# Patient Record
Sex: Male | Born: 1985 | Race: Black or African American | Hispanic: No | Marital: Single | Smoking: Never smoker
Health system: Southern US, Community
[De-identification: ages and names within clinical notes are randomized; demographics above are authoritative.]

## PROBLEM LIST (undated history)

## (undated) DIAGNOSIS — K859 Acute pancreatitis without necrosis or infection, unspecified: Secondary | ICD-10-CM

## (undated) DIAGNOSIS — L409 Psoriasis, unspecified: Secondary | ICD-10-CM

## (undated) DIAGNOSIS — T7840XA Allergy, unspecified, initial encounter: Secondary | ICD-10-CM

## (undated) DIAGNOSIS — K219 Gastro-esophageal reflux disease without esophagitis: Secondary | ICD-10-CM

## (undated) DIAGNOSIS — G43909 Migraine, unspecified, not intractable, without status migrainosus: Secondary | ICD-10-CM

## (undated) HISTORY — DX: Allergy, unspecified, initial encounter: T78.40XA

## (undated) HISTORY — DX: Migraine, unspecified, not intractable, without status migrainosus: G43.909

## (undated) HISTORY — DX: Psoriasis, unspecified: L40.9

## (undated) HISTORY — PX: LYMPH NODE DISSECTION: SHX5087

## (undated) HISTORY — DX: Gastro-esophageal reflux disease without esophagitis: K21.9

## (undated) HISTORY — PX: WISDOM TOOTH EXTRACTION: SHX21

---

## 2011-03-06 ENCOUNTER — Emergency Department: Payer: Self-pay | Admitting: Emergency Medicine

## 2011-05-07 ENCOUNTER — Emergency Department: Payer: Self-pay | Admitting: Emergency Medicine

## 2011-05-27 ENCOUNTER — Emergency Department (HOSPITAL_COMMUNITY)
Admission: EM | Admit: 2011-05-27 | Discharge: 2011-05-27 | Disposition: A | Discharge status: Home / Self Care | Payer: No Typology Code available for payment source | Attending: Emergency Medicine | Admitting: Emergency Medicine

## 2011-05-27 DIAGNOSIS — M79609 Pain in unspecified limb: Secondary | ICD-10-CM | POA: Insufficient documentation

## 2011-05-27 DIAGNOSIS — IMO0002 Reserved for concepts with insufficient information to code with codable children: Secondary | ICD-10-CM | POA: Insufficient documentation

## 2011-05-27 DIAGNOSIS — T148XXA Other injury of unspecified body region, initial encounter: Secondary | ICD-10-CM | POA: Insufficient documentation

## 2011-08-30 DIAGNOSIS — K859 Acute pancreatitis without necrosis or infection, unspecified: Secondary | ICD-10-CM

## 2011-08-30 HISTORY — DX: Acute pancreatitis without necrosis or infection, unspecified: K85.90

## 2011-10-09 ENCOUNTER — Emergency Department: Payer: Self-pay | Admitting: Internal Medicine

## 2011-10-09 LAB — URINALYSIS, COMPLETE
Bacteria: NONE SEEN
Bilirubin,UR: NEGATIVE
Glucose,UR: NEGATIVE mg/dL (ref 0–75)
Leukocyte Esterase: NEGATIVE
Nitrite: NEGATIVE
RBC,UR: 4 /HPF (ref 0–5)
Squamous Epithelial: NONE SEEN
WBC UR: <1 /HPF (ref 0–5)

## 2011-10-09 LAB — CBC
HCT: 44.9 % (ref 40.0–52.0)
HGB: 15.1 g/dL (ref 13.0–18.0)
RBC: 5.16 10*6/uL (ref 4.40–5.90)
RDW: 13.3 % (ref 11.5–14.5)
WBC: 14.2 10*3/uL — ABNORMAL HIGH (ref 3.8–10.6)

## 2011-10-09 LAB — COMPREHENSIVE METABOLIC PANEL
Alkaline Phosphatase: 81 U/L (ref 50–136)
Bilirubin,Total: 0.4 mg/dL (ref 0.2–1.0)
Calcium, Total: 8.9 mg/dL (ref 8.5–10.1)
Chloride: 101 mmol/L (ref 98–107)
Co2: 30 mmol/L (ref 21–32)
Creatinine: 0.88 mg/dL (ref 0.60–1.30)
EGFR (Non-African Amer.): >60
Osmolality: 276 (ref 275–301)
SGPT (ALT): 20 U/L

## 2011-10-22 ENCOUNTER — Emergency Department: Payer: Self-pay | Admitting: Emergency Medicine

## 2011-10-22 LAB — COMPREHENSIVE METABOLIC PANEL
BUN: 10 mg/dL (ref 7–18)
Bilirubin,Total: 1.2 mg/dL — ABNORMAL HIGH (ref 0.2–1.0)
Chloride: 102 mmol/L (ref 98–107)
Creatinine: 0.94 mg/dL (ref 0.60–1.30)
EGFR (African American): >60
Osmolality: 280 (ref 275–301)
SGPT (ALT): 19 U/L
Sodium: 141 mmol/L (ref 136–145)
Total Protein: 8.3 g/dL — ABNORMAL HIGH (ref 6.4–8.2)

## 2011-10-22 LAB — CBC
HGB: 16.4 g/dL (ref 13.0–18.0)
RBC: 5.61 10*6/uL (ref 4.40–5.90)
WBC: 15.7 10*3/uL — ABNORMAL HIGH (ref 3.8–10.6)

## 2011-10-22 LAB — LIPASE, BLOOD: Lipase: 339 U/L (ref 73–393)

## 2012-01-18 ENCOUNTER — Emergency Department: Payer: Self-pay | Admitting: Internal Medicine

## 2012-04-28 ENCOUNTER — Emergency Department: Payer: Self-pay | Admitting: Emergency Medicine

## 2012-04-28 LAB — COMPREHENSIVE METABOLIC PANEL
Alkaline Phosphatase: 99 U/L (ref 50–136)
BUN: 9 mg/dL (ref 7–18)
Chloride: 104 mmol/L (ref 98–107)
Creatinine: 0.99 mg/dL (ref 0.60–1.30)
EGFR (African American): >60
Glucose: 142 mg/dL — ABNORMAL HIGH (ref 65–99)
Potassium: 3.5 mmol/L (ref 3.5–5.1)
SGOT(AST): 23 U/L (ref 15–37)
SGPT (ALT): 13 U/L (ref 12–78)
Total Protein: 7.4 g/dL (ref 6.4–8.2)

## 2012-04-28 LAB — LIPASE, BLOOD: Lipase: 292 U/L (ref 73–393)

## 2012-04-28 LAB — CBC
HCT: 42.9 % (ref 40.0–52.0)
HGB: 14.5 g/dL (ref 13.0–18.0)
WBC: 9.4 10*3/uL (ref 3.8–10.6)

## 2013-02-08 ENCOUNTER — Emergency Department (HOSPITAL_COMMUNITY)
Admission: EM | Admit: 2013-02-08 | Discharge: 2013-02-09 | Disposition: A | Discharge status: Home / Self Care | Payer: Self-pay | Attending: Emergency Medicine | Admitting: Emergency Medicine

## 2013-02-08 ENCOUNTER — Emergency Department (HOSPITAL_COMMUNITY): Payer: Self-pay

## 2013-02-08 ENCOUNTER — Encounter (HOSPITAL_COMMUNITY): Payer: Self-pay

## 2013-02-08 DIAGNOSIS — R748 Abnormal levels of other serum enzymes: Secondary | ICD-10-CM

## 2013-02-08 DIAGNOSIS — R079 Chest pain, unspecified: Secondary | ICD-10-CM

## 2013-02-08 DIAGNOSIS — R109 Unspecified abdominal pain: Secondary | ICD-10-CM

## 2013-02-08 DIAGNOSIS — Z7982 Long term (current) use of aspirin: Secondary | ICD-10-CM | POA: Insufficient documentation

## 2013-02-08 DIAGNOSIS — R0789 Other chest pain: Secondary | ICD-10-CM | POA: Insufficient documentation

## 2013-02-08 DIAGNOSIS — Z880 Allergy status to penicillin: Secondary | ICD-10-CM | POA: Insufficient documentation

## 2013-02-08 DIAGNOSIS — R209 Unspecified disturbances of skin sensation: Secondary | ICD-10-CM | POA: Insufficient documentation

## 2013-02-08 HISTORY — DX: Acute pancreatitis without necrosis or infection, unspecified: K85.90

## 2013-02-08 LAB — CBC WITH DIFFERENTIAL/PLATELET
Basophils Relative: 1 % (ref 0–1)
Eosinophils Absolute: 1 10*3/uL — ABNORMAL HIGH (ref 0.0–0.7)
Eosinophils Relative: 8 % — ABNORMAL HIGH (ref 0–5)
MCH: 28 pg (ref 26.0–34.0)
MCHC: 34.7 g/dL (ref 30.0–36.0)
MCV: 80.6 fL (ref 78.0–100.0)
Neutrophils Relative %: 57 % (ref 43–77)
Platelets: 256 10*3/uL (ref 150–400)

## 2013-02-08 LAB — COMPREHENSIVE METABOLIC PANEL
ALT: 7 U/L (ref 0–53)
Albumin: 3.9 g/dL (ref 3.5–5.2)
Alkaline Phosphatase: 82 U/L (ref 39–117)
BUN: 10 mg/dL (ref 6–23)
Calcium: 8.9 mg/dL (ref 8.4–10.5)
GFR calc Af Amer: >90 mL/min (ref >90)
Glucose, Bld: 79 mg/dL (ref 70–99)
Potassium: 3.4 mEq/L — ABNORMAL LOW (ref 3.5–5.1)
Sodium: 137 mEq/L (ref 135–145)
Total Protein: 7.6 g/dL (ref 6.0–8.3)

## 2013-02-08 LAB — LIPASE, BLOOD: Lipase: 83 U/L — ABNORMAL HIGH (ref 11–59)

## 2013-02-08 NOTE — ED Notes (Signed)
Patient transported to X-ray 

## 2013-02-08 NOTE — ED Notes (Signed)
Pt reports experiencing L side abdomen pain that has increased more frequently over the last month. Pt also reports having sharp chest pain where his arm and neck goes numb on R side. Chest pain makes him feel out of breath. Dx with Acute pancreatitis in 2013.

## 2013-02-09 LAB — URINE MICROSCOPIC-ADD ON

## 2013-02-09 LAB — URINALYSIS, ROUTINE W REFLEX MICROSCOPIC
Bilirubin Urine: NEGATIVE
Nitrite: NEGATIVE
Specific Gravity, Urine: 1.016 (ref 1.005–1.030)
Urobilinogen, UA: 0.2 mg/dL (ref 0.0–1.0)
pH: 7.5 (ref 5.0–8.0)

## 2013-02-09 MED ORDER — PANTOPRAZOLE SODIUM 20 MG PO TBEC: 40.0000 mg | DELAYED_RELEASE_TABLET | Freq: Every day | ORAL | Status: DC

## 2013-02-09 MED ORDER — PANTOPRAZOLE SODIUM 40 MG PO TBEC
40.0000 mg | DELAYED_RELEASE_TABLET | Freq: Once | ORAL | Status: AC
  Administered 2013-02-09: 40 mg via ORAL
  Filled 2013-02-09: qty 1

## 2013-02-09 NOTE — Discharge Instructions (Signed)
Abdominal Pain (Nonspecific) Your exam might not show the exact reason you have abdominal pain. Since there are many different causes of abdominal pain, another checkup and more tests may be needed. It is very important to follow up for lasting (persistent) or worsening symptoms. A possible cause of abdominal pain in any person who still has his or her appendix is acute appendicitis. Appendicitis is often hard to diagnose. Normal blood tests, urine tests, ultrasound, and CT scans do not completely rule out early appendicitis or other causes of abdominal pain. Sometimes, only the changes that happen over time will allow appendicitis and other causes of abdominal pain to be determined. Other potential problems that may require surgery may also take time to become more apparent. Because of this, it is important that you follow all of the instructions below. HOME CARE INSTRUCTIONS   Rest as much as possible.  Do not eat solid food until your pain is gone.  While adults or children have pain: A diet of water, weak decaffeinated tea, broth or bouillon, gelatin, oral rehydration solutions (ORS), frozen ice pops, or ice chips may be helpful.  When pain is gone in adults or children: Start a light diet (dry toast, crackers, applesauce, or white rice). Increase the diet slowly as long as it does not bother you. Eat no dairy products (including cheese and eggs) and no spicy, fatty, fried, or high-fiber foods.  Use no alcohol, caffeine, or cigarettes.  Take your regular medicines unless your caregiver told you not to.  Take any prescribed medicine as directed.  Only take over-the-counter or prescription medicines for pain, discomfort, or fever as directed by your caregiver. Do not give aspirin to children. If your caregiver has given you a follow-up appointment, it is very important to keep that appointment. Not keeping the appointment could result in a permanent injury and/or lasting (chronic) pain and/or  disability. If there is any problem keeping the appointment, you must call to reschedule.  SEEK IMMEDIATE MEDICAL CARE IF:   Your pain is not gone in 24 hours.  Your pain becomes worse, changes location, or feels different.  You or your child has an oral temperature above 102 F (38.9 C), not controlled by medicine.  Your baby is older than 3 months with a rectal temperature of 102 F (38.9 C) or higher.  Your baby is 52 months old or younger with a rectal temperature of 100.4 F (38 C) or higher.  You have shaking chills.  You keep throwing up (vomiting) or cannot drink liquids.  There is blood in your vomit or you see blood in your bowel movements.  Your bowel movements become dark or black.  You have frequent bowel movements.  Your bowel movements stop (become blocked) or you cannot pass gas.  You have bloody, frequent, or painful urination.  You have yellow discoloration in the skin or whites of the eyes.  Your stomach becomes bloated or bigger.  You have dizziness or fainting.  You have chest or back pain. MAKE SURE YOU:   Understand these instructions.  Will watch your condition.  Will get help right away if you are not doing well or get worse. Document Released: 08/15/2005 Document Revised: 11/07/2011 Document Reviewed: 07/13/2009 [facility] Patient Information 2014 [place], [place].  Chest Pain (Nonspecific) Chest pain has many causes. Your pain could be caused by something serious, such as a heart attack or a blood clot in the lungs. It could also be caused by something less serious, such as  a chest bruise or a virus. Follow up with your doctor. More lab tests or other studies may be needed to find the cause of your pain. Most of the time, nonspecific chest pain will improve within 2 to 3 days of rest and mild pain medicine. HOME CARE  For chest bruises, you may put ice on the sore area for 15-20 minutes, 3-4 times a day. Do this only if it makes you or your  child feel better.  Put ice in a plastic bag.  Place a towel between the skin and the bag.  Rest for the next 2 to 3 days.  Go back to work if the pain improves.  See your doctor if the pain lasts longer than 1 to 2 weeks.  Only take medicine as told by your doctor.  Quit smoking if you smoke. GET HELP RIGHT AWAY IF:   There is more pain or pain that spreads to the arm, neck, jaw, back, or belly (abdomen).  You or your child has shortness of breath.  You or your child coughs more than usual or coughs up blood.  You or your child has very bad back or belly pain, feels sick to his or her stomach (nauseous), or throws up (vomits).  You or your child has very bad weakness.  You or your child passes out (faints).  You or your child has a temperature by mouth above 102 F (38.9 C), not controlled by medicine. Any of these problems may be serious and may be an emergency. Do not wait to see if the problems will go away. Get medical help right away. Call your local emergency services 911 in [place] Do not drive yourself to the hospital. MAKE SURE YOU:   Understand these instructions.  Will watch this condition.  Will get help right away if you or your child is not doing well or gets worse. Document Released: 02/01/2008 Document Revised: 11/07/2011 Document Reviewed: 02/01/2008 [facility] Patient Information 2014 [address], [place].  Flank Pain Flank pain refers to pain that is located on the side of the body between the upper abdomen and the back. The pain may occur over a short period of time (acute) or may be long-term or reoccurring (chronic). It may be mild or severe. Flank pain can be caused by many things. CAUSES  Some of the more common causes of flank pain include:  Muscle strains.   Muscle spasms.   A disease of your spine (vertebral disk disease).   A lung infection (pneumonia).   Fluid around your lungs (pulmonary edema).   A kidney infection.   Kidney  stones.   A very painful skin rash caused by the chickenpox virus (shingles).   Gallbladder disease.  HOME CARE INSTRUCTIONS  Home care will depend on the cause of your pain. In general,  Rest as directed by your caregiver.  Drink enough fluids to keep your urine clear or pale yellow.  Only take over-the-counter or prescription medicines as directed by your caregiver. Some medicines may help relieve the pain.  Tell your caregiver about any changes in your pain.  Follow up with your caregiver as directed. SEEK IMMEDIATE MEDICAL CARE IF:   Your pain is not controlled with medicine.   You have new or worsening symptoms.  Your pain increases.   You have abdominal pain.   You have shortness of breath.   You have persistent nausea or vomiting.   You have swelling in your abdomen.   You feel faint or  pass out.   You have blood in your urine.  You have a fever or persistent symptoms for more than 2 3 days.  You have a fever and your symptoms suddenly get worse. MAKE SURE YOU:   Understand these instructions.  Will watch your condition.  Will get help right away if you are not doing well or get worse. Document Released: 10/06/2005 Document Revised: 05/09/2012 Document Reviewed: 03/29/2012 [facility] Patient Information 2014 [place], [place].

## 2013-02-09 NOTE — ED Provider Notes (Signed)
History     CSN: [id number]  Arrival date & time 02/08/13  2211   First MD Initiated Contact with Patient 02/08/13 2304      Chief Complaint  Patient presents with  . Abdominal Pain    (Consider location/radiation/quality/duration/timing/severity/associated sxs/prior treatment) HPI 27 year old male presents to emergency department complaining of intermittent left flank pain and occasional chest pain, radiating to his right neck and arm.  Symptoms have been ongoing for the last 2-3 months.  Patient reports he only has chest pain when he has flank pain, but will often have flank pain, without chest pain.  He has associated the pain with drinking sodas.  He denies any nausea, vomiting, fevers.  When he has  chest pain, itt is sharp, central radiates to his neck and his right arm.  He then reports his right neck and arm becomes numb.  Symptoms last no more than 20 seconds.  He has one episode every few days.  Left flank pain is dull.  Nonradiating.  He reports regular bowel movements.  No urinary symptoms.  He reports he was told at [facility] that he had pancreatitis about a year ago, but in followup was told this was a misdiagnosis.  He does not have a primary care Dr. Past Medical History  Diagnosis Date  . Acute pancreatitis 2013    History reviewed. No pertinent past surgical history.  History reviewed. No pertinent family history.  History  Substance Use Topics  . Smoking status: Never Smoker   . Smokeless tobacco: Not on file  . Alcohol Use: No      Review of Systems  All other systems reviewed and are negative.    Allergies  Tylenol and Penicillins  Home Medications   Current Outpatient Rx  Name  Route  Sig  Dispense  Refill  . aspirin EC 325 MG tablet   Oral   Take 325-650 mg by mouth daily as needed for pain.           BP 127/76  Pulse 67  Temp(Src) 98.9 F (37.2 C) (Oral)  Resp 20  Ht 5\' 8"  (1.727 m)  Wt 120 lb (54.432 kg)  BMI 18.25 kg/m2  SpO2  100%  Physical Exam  Nursing note and vitals reviewed. Constitutional: He is oriented to person, place, and time. He appears well-developed and well-nourished.  HENT:  Head: Normocephalic and atraumatic.  Nose: Nose normal.  Mouth/Throat: Oropharynx is clear and moist.  Eyes: Conjunctivae and EOM are normal. Pupils are equal, round, and reactive to light.  Neck: Normal range of motion. Neck supple. No JVD present. No tracheal deviation present. No thyromegaly present.  Cardiovascular: Normal rate, regular rhythm, normal heart sounds and intact distal pulses.  Exam reveals no gallop and no friction rub.   No murmur heard. Pulmonary/Chest: Effort normal and breath sounds normal. No stridor. No respiratory distress. He has no wheezes. He has no rales. He exhibits no tenderness.  Abdominal: Soft. Bowel sounds are normal. He exhibits no distension and no mass. There is tenderness (mild tenderness to palpation along with flank). There is no rebound and no guarding.  Musculoskeletal: Normal range of motion. He exhibits no edema and no tenderness.  Lymphadenopathy:    He has no cervical adenopathy.  Neurological: He is alert and oriented to person, place, and time. He exhibits normal muscle tone. Coordination normal.  Skin: Skin is warm and dry. No rash noted. No erythema. No pallor.  Psychiatric: He has a normal mood and affect.  His behavior is normal. Judgment and thought content normal.    ED Course  Procedures (including critical care time)  Labs Reviewed  CBC WITH DIFFERENTIAL - Abnormal; Notable for the following:    WBC 13.4 (*)    Eosinophils Relative 8 (*)    Eosinophils Absolute 1.0 (*)    Basophils Absolute 0.2 (*)    All other components within normal limits  COMPREHENSIVE METABOLIC PANEL - Abnormal; Notable for the following:    Potassium 3.4 (*)    All other components within normal limits  LIPASE, BLOOD - Abnormal; Notable for the following:    Lipase 83 (*)    All other  components within normal limits  URINALYSIS, ROUTINE W REFLEX MICROSCOPIC - Abnormal; Notable for the following:    Leukocytes, UA SMALL (*)    All other components within normal limits  URINE CULTURE  URINE MICROSCOPIC-ADD ON   Dg Chest 2 View  02/08/2013   *RADIOLOGY REPORT*  Clinical Data: Chest pain  CHEST - 2 VIEW  Comparison: None.  Findings: No pneumothorax.  Symmetric biapical pleuroparenchymal scarring.  Mild pulmonary hyperinflation without focal infiltrate or overt edema.  No effusion.  Heart size normal.  Regional bones unremarkable.  IMPRESSION:  Hyperinflation without acute or superimposed abnormality.   Original Report Authenticated By: D. Andria Rhein, MD    Date: 02/09/2013  Rate:67  Rhythm: normal sinus rhythm  QRS Axis: normal  Intervals: normal  ST/T Wave abnormalities: normal  Conduction Disutrbances:none  Narrative Interpretation:   Old EKG Reviewed: none available    1. Abdominal pain   2. Chest pain   3. Elevated lipase       MDM  27 year old male with intermittent chest and abdominal pain ongoing for months.  EKG normal.  Labs show slight elevation in white blood cell count, and eosinophils, slight bump in his lipase.  We'll start him on a PPI, have him decrease his soda intake, and refer him to a primary care Dr. for further workup.  I do not feel he has an emergent condition at this time that require further workup.  We'll give him strict precautions for return       Olivia Mackie, MD 02/09/13 [id number]

## 2013-02-10 LAB — URINE CULTURE

## 2013-04-09 ENCOUNTER — Emergency Department: Payer: Self-pay | Admitting: Emergency Medicine

## 2015-06-04 ENCOUNTER — Encounter: Payer: Self-pay | Admitting: Emergency Medicine

## 2015-06-04 ENCOUNTER — Emergency Department
Admission: EM | Admit: 2015-06-04 | Discharge: 2015-06-04 | Disposition: A | Discharge status: Home / Self Care | Payer: Self-pay | Attending: Emergency Medicine | Admitting: Emergency Medicine

## 2015-06-04 ENCOUNTER — Other Ambulatory Visit: Payer: Self-pay

## 2015-06-04 ENCOUNTER — Emergency Department: Payer: Self-pay

## 2015-06-04 DIAGNOSIS — Z880 Allergy status to penicillin: Secondary | ICD-10-CM | POA: Insufficient documentation

## 2015-06-04 DIAGNOSIS — Z79899 Other long term (current) drug therapy: Secondary | ICD-10-CM | POA: Insufficient documentation

## 2015-06-04 DIAGNOSIS — R0789 Other chest pain: Secondary | ICD-10-CM | POA: Insufficient documentation

## 2015-06-04 DIAGNOSIS — R002 Palpitations: Secondary | ICD-10-CM | POA: Insufficient documentation

## 2015-06-04 DIAGNOSIS — F419 Anxiety disorder, unspecified: Secondary | ICD-10-CM | POA: Insufficient documentation

## 2015-06-04 LAB — HEPATIC FUNCTION PANEL
ALK PHOS: 69 U/L (ref 38–126)
ALT: 10 U/L — AB (ref 17–63)
AST: 16 U/L (ref 15–41)
Albumin: 4.3 g/dL (ref 3.5–5.0)
BILIRUBIN DIRECT: 0.1 mg/dL (ref 0.1–0.5)
BILIRUBIN INDIRECT: 0.7 mg/dL (ref 0.3–0.9)
BILIRUBIN TOTAL: 0.8 mg/dL (ref 0.3–1.2)
Total Protein: 7.3 g/dL (ref 6.5–8.1)

## 2015-06-04 LAB — CBC
HCT: 45.4 % (ref 40.0–52.0)
Hemoglobin: 15.5 g/dL (ref 13.0–18.0)
MCH: 28.7 pg (ref 26.0–34.0)
MCHC: 34.1 g/dL (ref 32.0–36.0)
MCV: 84.1 fL (ref 80.0–100.0)
PLATELETS: 236 10*3/uL (ref 150–440)
RBC: 5.4 MIL/uL (ref 4.40–5.90)
RDW: 13.6 % (ref 11.5–14.5)
WBC: 9.2 10*3/uL (ref 3.8–10.6)

## 2015-06-04 LAB — BASIC METABOLIC PANEL
ANION GAP: 6 (ref 5–15)
BUN: 13 mg/dL (ref 6–20)
CALCIUM: 9.3 mg/dL (ref 8.9–10.3)
CO2: 32 mmol/L (ref 22–32)
Chloride: 103 mmol/L (ref 101–111)
Creatinine, Ser: 1 mg/dL (ref 0.61–1.24)
GFR calc Af Amer: >60 mL/min (ref >60)
GLUCOSE: 93 mg/dL (ref 65–99)
Potassium: 3.8 mmol/L (ref 3.5–5.1)
Sodium: 141 mmol/L (ref 135–145)

## 2015-06-04 LAB — TSH: TSH: 0.598 u[IU]/mL (ref 0.350–4.500)

## 2015-06-04 LAB — TROPONIN I

## 2015-06-04 LAB — LIPASE, BLOOD: Lipase: 40 U/L (ref 22–51)

## 2015-06-04 MED ORDER — PANTOPRAZOLE SODIUM 20 MG PO TBEC: 40.0000 mg | DELAYED_RELEASE_TABLET | Freq: Every day | ORAL | Status: DC

## 2015-06-04 NOTE — ED Provider Notes (Signed)
CSN: [id number]     Arrival date & time 06/04/15  1111 History   First MD Initiated Contact with Patient 06/04/15 1157     Chief Complaint  Patient presents with  . Chest Pain  . Palpitations     (Consider location/radiation/quality/duration/timing/severity/associated sxs/prior Treatment) The history is provided by the patient.  Zachary Hartman is a 29 y.o. male otherwise healthy here presenting with chest pain, palpitations. Patient states that he has intermittent chest pains or palpitations. States that he usually gets them when he was waking up in the morning. Pain is on the left side and he feels "shock waves going down his arm" lasting about 5 minutes. Also has some palpitations and feels like his heart is racing. Has been intermittently happening for the last 2 years but now more frequent and gets it 3-4 times a week. He states that it is worse when he moves around and also improves when he burps. Denies any abdominal pain. Patient has no history of CAD. Denies any shortness of breath. Patient is not a smoker. Patient does have family history of MI age 59s. Patient has hx of pancreatitis but denies alcohol use and states that symptoms not as severe. Didn't have insurance before, now has insurance but no primary care doctor.    Past Medical History  Diagnosis Date  . Acute pancreatitis 2013   No past surgical history on file. History reviewed. No pertinent family history. Social History  Substance Use Topics  . Smoking status: Never Smoker   . Smokeless tobacco: None  . Alcohol Use: No    Review of Systems  Cardiovascular: Positive for chest pain and palpitations.  All other systems reviewed and are negative.     Allergies  Tylenol and Penicillins  Home Medications   Prior to Admission medications   Medication Sig Start Date End Date Taking? Authorizing Provider  aspirin EC 325 MG tablet Take 325-650 mg by mouth daily as needed for pain.   Yes Historical Provider, MD   pantoprazole (PROTONIX) 20 MG tablet Take 2 tablets (40 mg total) by mouth daily. 02/09/13   Marisa Severin, MD   BP 124/78 mmHg  Pulse 69  Temp(Src) 97.4 F (36.3 C) (Oral)  Resp 14  Ht  (1.727 m)  Wt 110 lb (49.896 kg)  BMI 16.73 kg/m2  SpO2 100% Physical Exam  Constitutional: He is oriented to person, place, and time.  Anxious   HENT:  Head: Normocephalic.  Mouth/Throat: Oropharynx is clear and moist.  Eyes: Conjunctivae are normal. Pupils are equal, round, and reactive to light.  Neck: Normal range of motion. Neck supple.  Cardiovascular: Normal rate, regular rhythm and normal heart sounds.   Pulmonary/Chest: Effort normal and breath sounds normal. No respiratory distress. He has no wheezes. He has no rales.  Mild reproducible L sided chest tenderness   Abdominal: Soft. Bowel sounds are normal. He exhibits no distension. There is no tenderness. There is no rebound.  Musculoskeletal: Normal range of motion. He exhibits no edema or tenderness.  Neurological: He is alert and oriented to person, place, and time. No cranial nerve deficit. Coordination normal.  Skin: Skin is warm and dry.  Psychiatric: He has a normal mood and affect. His behavior is normal. Judgment and thought content normal.  Nursing note and vitals reviewed.   ED Course  Procedures (including critical care time) Labs Review Labs Reviewed  HEPATIC FUNCTION PANEL - Abnormal; Notable for the following:    ALT 10 (*)  All other components within normal limits  BASIC METABOLIC PANEL  CBC  TROPONIN I  TSH  LIPASE, BLOOD    Imaging Review Dg Chest 2 View  06/04/2015   CLINICAL DATA:  Chronic heart palpitations increasing in frequency.  EXAM: CHEST  2 VIEW  COMPARISON:  02/08/2013  FINDINGS: The cardiac silhouette, mediastinal and hilar contours are within normal limits and stable. The lungs demonstrate mild hyperinflation but no infiltrates, edema or effusions. No pulmonary lesions the bony thorax is  intact.  IMPRESSION: Mild hyperinflation but no acute cardiopulmonary findings.   Electronically Signed   By: Rudie Meyer M.D.   On: 06/04/2015 11:54   I have personally reviewed and evaluated these images and lab results as part of my medical decision-making.   EKG Interpretation None      MDM   Final diagnoses:  None    DAMEER SPEISER is a 29 y.o. male here with chest pain. Pain reproducible, likely MSK. Also consider gastritis vs pancreatitis. Less likely ACS and symptoms for a year. Consider getting TSH for palpitations. Will check labs, CXR, trop x 1.   2:39 PM Vitals stable. LFTs and lipase nl. TSH nl. Likely gastritis vs reflux. Will start back on protonix. Will have him f/u with [facility].      Richardean Canal, MD 06/04/15 1440

## 2015-06-04 NOTE — ED Notes (Signed)
Says he has shock pain in chest that increases with movement and feels like his heart skips beats.  Says it has been happening for about 2 years, but he did not have insurance and now frequency has increased.

## 2015-06-04 NOTE — ED Notes (Signed)
Lab able to add on TSH to lasb sent from Triage

## 2015-06-04 NOTE — Discharge Instructions (Signed)
Take protonix daily.   Stay hydrated.  See [facility] for follow up.   You may need a heart monitor if you still have palpitations.  Return to ER if you have worse chest pain, palpitations, shortness of breath.

## 2015-08-16 ENCOUNTER — Encounter: Payer: Self-pay | Admitting: *Deleted

## 2015-08-16 ENCOUNTER — Emergency Department
Admission: EM | Admit: 2015-08-16 | Discharge: 2015-08-16 | Disposition: A | Discharge status: Home / Self Care | Payer: Self-pay | Attending: Emergency Medicine | Admitting: Emergency Medicine

## 2015-08-16 ENCOUNTER — Emergency Department: Payer: Self-pay

## 2015-08-16 DIAGNOSIS — Z79899 Other long term (current) drug therapy: Secondary | ICD-10-CM | POA: Insufficient documentation

## 2015-08-16 DIAGNOSIS — Z880 Allergy status to penicillin: Secondary | ICD-10-CM | POA: Insufficient documentation

## 2015-08-16 DIAGNOSIS — R1012 Left upper quadrant pain: Secondary | ICD-10-CM | POA: Insufficient documentation

## 2015-08-16 DIAGNOSIS — R1013 Epigastric pain: Secondary | ICD-10-CM | POA: Insufficient documentation

## 2015-08-16 LAB — COMPREHENSIVE METABOLIC PANEL
ALBUMIN: 4.6 g/dL (ref 3.5–5.0)
ALK PHOS: 70 U/L (ref 38–126)
ALT: 12 U/L — AB (ref 17–63)
ANION GAP: 6 (ref 5–15)
AST: 18 U/L (ref 15–41)
BILIRUBIN TOTAL: 0.7 mg/dL (ref 0.3–1.2)
BUN: 11 mg/dL (ref 6–20)
CALCIUM: 9.2 mg/dL (ref 8.9–10.3)
CO2: 32 mmol/L (ref 22–32)
CREATININE: 0.89 mg/dL (ref 0.61–1.24)
Chloride: 101 mmol/L (ref 101–111)
GFR calc Af Amer: >60 mL/min (ref >60)
GFR calc non Af Amer: >60 mL/min (ref >60)
GLUCOSE: 99 mg/dL (ref 65–99)
Potassium: 3.8 mmol/L (ref 3.5–5.1)
Sodium: 139 mmol/L (ref 135–145)
TOTAL PROTEIN: 7.9 g/dL (ref 6.5–8.1)

## 2015-08-16 LAB — URINALYSIS COMPLETE WITH MICROSCOPIC (ARMC ONLY)
Bacteria, UA: NONE SEEN
Bilirubin Urine: NEGATIVE
GLUCOSE, UA: NEGATIVE mg/dL
Hgb urine dipstick: NEGATIVE
KETONES UR: NEGATIVE mg/dL
Leukocytes, UA: NEGATIVE
NITRITE: NEGATIVE
Protein, ur: NEGATIVE mg/dL
SPECIFIC GRAVITY, URINE: 1.026 (ref 1.005–1.030)
Squamous Epithelial / LPF: NONE SEEN
pH: 6 (ref 5.0–8.0)

## 2015-08-16 LAB — CBC
HCT: 44.8 % (ref 40.0–52.0)
Hemoglobin: 15.1 g/dL (ref 13.0–18.0)
MCH: 28.7 pg (ref 26.0–34.0)
MCHC: 33.6 g/dL (ref 32.0–36.0)
MCV: 85.4 fL (ref 80.0–100.0)
PLATELETS: 252 10*3/uL (ref 150–440)
RBC: 5.25 MIL/uL (ref 4.40–5.90)
RDW: 13.4 % (ref 11.5–14.5)
WBC: 13.5 10*3/uL — ABNORMAL HIGH (ref 3.8–10.6)

## 2015-08-16 LAB — LIPASE, BLOOD: Lipase: 60 U/L — ABNORMAL HIGH (ref 11–51)

## 2015-08-16 MED ORDER — SUCRALFATE 1 G PO TABS: 1.0000 g | ORAL_TABLET | Freq: Two times a day (BID) | ORAL | Status: DC

## 2015-08-16 MED ORDER — SODIUM CHLORIDE 0.9 % IV BOLUS (SEPSIS)
1000.0000 mL | Freq: Once | INTRAVENOUS | Status: AC
  Administered 2015-08-16: 1000 mL via INTRAVENOUS

## 2015-08-16 MED ORDER — FAMOTIDINE 40 MG PO TABS: 40.0000 mg | ORAL_TABLET | Freq: Every evening | ORAL | Status: DC

## 2015-08-16 NOTE — ED Notes (Signed)
Pt sitting in lobby, texting on phone in no acute distress.  

## 2015-08-16 NOTE — ED Notes (Addendum)
Pt c/o sharp pain in lower abdomen, RLQ radiating across abdomen. Pt denies n/v/d. Pt denies fever. Pt states last normal BM Sat. Evening. Pt denies loss of appetite. Pt denies dysuria. Pt admits to ETOH use on Friday x 2 drinks.

## 2015-08-16 NOTE — ED Notes (Signed)
Pts IV blew. RN tried restarting IV. Pt refused anymore IVs. MD aware.

## 2015-08-16 NOTE — Discharge Instructions (Signed)

## 2015-08-16 NOTE — ED Provider Notes (Signed)
[facility] Emergency Department Provider Note  ____________________________________________  Time seen: Approximately 6:21 AM  I have reviewed the triage vital signs and the nursing notes.   HISTORY  Chief Complaint Abdominal Pain    HPI ISAC Zachary Hartman is a 29 y.o. Zachary Hartman who comes into the hospital today with left upper quadrant pain. The patient reports that he was on Zachary way to work when he started having some left upper quadrant pain.The patient reports that it was sharp pain that lasted about 15 minutes. He reports that the pain went away and then came back off and on. The patient denies any vomiting or diarrhea. He reports that he has had pain like this in the past but it was not as intense as it was tonight. He reports that he does not have any pain currently. The patient denies any shortness of breath any chest pain or sweats. He reports that he takes aspirin regularly but has not taken any in the last few days. The patient did not take anything for pain at home.   Past Medical History  Diagnosis Date  . Acute pancreatitis 2013    There are no active problems to display for this patient.   History reviewed. No pertinent past surgical history.  Current Outpatient Rx  Name  Route  Sig  Dispense  Refill  . aspirin EC 325 MG tablet   Oral   Take 325-650 mg by mouth daily as needed for pain.         . famotidine (PEPCID) 40 MG tablet   Oral   Take 1 tablet (40 mg total) by mouth every evening.   15 tablet   0   . pantoprazole (PROTONIX) 20 MG tablet   Oral   Take 2 tablets (40 mg total) by mouth daily.   30 tablet   0   . sucralfate (CARAFATE) 1 G tablet   Oral   Take 1 tablet (1 g total) by mouth 2 (two) times daily.   20 tablet   0     Allergies Tylenol and Penicillins  History reviewed. No pertinent family history.  Social History Social History  Substance Use Topics  . Smoking status: Never Smoker   . Smokeless  tobacco: Never Used  . Alcohol Use: No    Review of Systems Constitutional: No fever/chills Eyes: No visual changes. ENT: No sore throat. Cardiovascular: Denies chest pain. Respiratory: Denies shortness of breath. Gastrointestinal:  abdominal pain.  No nausea, no vomiting.  No diarrhea.  No constipation. Genitourinary: Negative for dysuria. Musculoskeletal: Negative for back pain. Skin: Negative for rash. Neurological: Negative for headaches, focal weakness or numbness.  10-point ROS otherwise negative.  ____________________________________________   PHYSICAL EXAM:  VITAL SIGNS: ED Triage Vitals  Enc Vitals Group     BP 08/16/15 0327 118/74 mmHg     Pulse Rate 08/16/15 0327 78     Resp 08/16/15 0327 16     Temp 08/16/15 0327 98.3 F (36.8 C)     Temp Source 08/16/15 0327 Oral     SpO2 08/16/15 0327 100 %     Weight 08/16/15 0327 118 lb (53.524 kg)     Height 08/16/15 0327  (1.753 m)     Head Cir --      Peak Flow --      Pain Score --      Pain Loc --      Pain Edu? --      Excl. in  GC? --     Constitutional: Alert and oriented. Well appearing and in mild distress. Eyes: Conjunctivae are normal. PERRL. EOMI. Head: Atraumatic. Nose: No congestion/rhinnorhea. Mouth/Throat: Mucous membranes are moist.  Oropharynx non-erythematous. Cardiovascular: Normal rate, regular rhythm. Grossly normal heart sounds.  Good peripheral circulation. Respiratory: Normal respiratory effort.  No retractions. Lungs CTAB. Gastrointestinal: Soft with mild epigastric tenderness to palpation. No distention. Positive bowel sounds Musculoskeletal: No lower extremity tenderness nor edema.   Neurologic:  Normal speech and language.  Skin:  Skin is warm, dry and intact.  Psychiatric: Mood and affect are normal.   ____________________________________________   LABS (all labs ordered are listed, but only abnormal results are displayed)  Labs Reviewed  LIPASE, BLOOD - Abnormal; Notable  for the following:    Lipase 60 (*)    All other components within normal limits  COMPREHENSIVE METABOLIC PANEL - Abnormal; Notable for the following:    ALT 12 (*)    All other components within normal limits  CBC - Abnormal; Notable for the following:    WBC 13.5 (*)    All other components within normal limits  URINALYSIS COMPLETEWITH MICROSCOPIC ([facility] ONLY) - Abnormal; Notable for the following:    Color, Urine YELLOW (*)    APPearance CLEAR (*)    All other components within normal limits   ____________________________________________  EKG  None ____________________________________________  RADIOLOGY  KUB: No acute abnormality noted ____________________________________________   PROCEDURES  Procedure(s) performed: None  Critical Care performed: No  ____________________________________________   INITIAL IMPRESSION / ASSESSMENT AND PLAN / ED COURSE  Pertinent labs & imaging results that were available during my care of the patient were reviewed by me and considered in my medical decision making (see chart for details).  This is a 29 year old Zachary Hartman who comes into the hospital today with left upper quadrant pain. The patient reported that he does take aspirin selected a KUB looking for possible free air under the diaphragm. The patient reports though that all of Zachary pain is improved. I feel the patient may have some gastritis which is causing Zachary pain that comes on and off. The patient also has some mild elevation of Zachary lipase with a concern for pancreatitis. I attempted to give the patient some IV hydration but Zachary IV fluid and the patient did not want any more IV sticks. We will orally hydrate the patient and have him go home to follow-up with Zachary primary care physician or (clinic. The patient agrees with this plan reports he does still feel improved.  The patient will be discharged to home. ____________________________________________   FINAL CLINICAL IMPRESSION(S)  / ED DIAGNOSES  Final diagnoses:  Left upper quadrant pain      Rebecka ApleyAllison P Chae Shuster, MD 08/16/15 [phone]

## 2015-08-25 ENCOUNTER — Ambulatory Visit: Payer: Self-pay

## 2015-08-26 ENCOUNTER — Other Ambulatory Visit: Payer: Self-pay | Admitting: Nurse Practitioner

## 2015-08-26 DIAGNOSIS — R0602 Shortness of breath: Secondary | ICD-10-CM

## 2015-08-26 DIAGNOSIS — R079 Chest pain, unspecified: Secondary | ICD-10-CM

## 2015-08-27 ENCOUNTER — Other Ambulatory Visit: Payer: Self-pay

## 2015-08-27 LAB — HEPATIC FUNCTION PANEL
ALT: 9 U/L — AB (ref 10–40)
AST: 18 U/L (ref 14–40)
Alkaline Phosphatase: 77 U/L (ref 25–125)
Bilirubin, Total: 0.3 mg/dL

## 2015-08-27 LAB — BASIC METABOLIC PANEL
BUN: 9 mg/dL (ref 4–21)
CREATININE: 0.9 mg/dL (ref 0.6–1.3)
GLUCOSE: 113 mg/dL
Sodium: 140 mmol/L (ref 137–147)

## 2015-08-27 LAB — CBC AND DIFFERENTIAL
NEUTROS ABS: 4 /uL
WBC: 8.3 10*3/mL

## 2015-09-02 ENCOUNTER — Ambulatory Visit: Payer: No Typology Code available for payment source

## 2015-09-09 ENCOUNTER — Ambulatory Visit: Payer: Self-pay

## 2015-09-10 ENCOUNTER — Ambulatory Visit: Payer: Self-pay

## 2015-10-05 ENCOUNTER — Ambulatory Visit
Admission: RE | Admit: 2015-10-05 | Discharge: 2015-10-05 | Disposition: A | Discharge status: Home / Self Care | Payer: Self-pay | Source: Ambulatory Visit | Attending: Nurse Practitioner | Admitting: Nurse Practitioner

## 2015-10-05 DIAGNOSIS — R0602 Shortness of breath: Secondary | ICD-10-CM | POA: Insufficient documentation

## 2015-10-05 DIAGNOSIS — R079 Chest pain, unspecified: Secondary | ICD-10-CM | POA: Insufficient documentation

## 2015-10-05 NOTE — Progress Notes (Signed)
*  PRELIMINARY RESULTS* Echocardiogram 2D Echocardiogram has been performed.  Zachary Hartman 10/05/2015, 10:32 AM

## 2015-10-20 ENCOUNTER — Encounter: Payer: Self-pay | Admitting: Emergency Medicine

## 2015-10-20 ENCOUNTER — Emergency Department: Payer: No Typology Code available for payment source

## 2015-10-20 ENCOUNTER — Emergency Department
Admission: EM | Admit: 2015-10-20 | Discharge: 2015-10-20 | Disposition: A | Discharge status: Home / Self Care | Payer: No Typology Code available for payment source | Attending: Emergency Medicine | Admitting: Emergency Medicine

## 2015-10-20 DIAGNOSIS — Z880 Allergy status to penicillin: Secondary | ICD-10-CM | POA: Insufficient documentation

## 2015-10-20 DIAGNOSIS — R0789 Other chest pain: Secondary | ICD-10-CM

## 2015-10-20 DIAGNOSIS — R0982 Postnasal drip: Secondary | ICD-10-CM | POA: Insufficient documentation

## 2015-10-20 DIAGNOSIS — Z79899 Other long term (current) drug therapy: Secondary | ICD-10-CM | POA: Insufficient documentation

## 2015-10-20 DIAGNOSIS — R071 Chest pain on breathing: Secondary | ICD-10-CM

## 2015-10-20 NOTE — ED Notes (Signed)
Pt to ed with c/o left side rib pain that started about 2 pm today.  Pt states pain worse with movement, worse with cough.  Denies injury.

## 2015-10-20 NOTE — ED Notes (Signed)
Left sided rib pain for couple of days w/o injury  Increased pain with movement

## 2015-10-20 NOTE — Discharge Instructions (Signed)

## 2015-10-20 NOTE — ED Provider Notes (Signed)
[facility] Emergency Department Provider Note  ____________________________________________  Time seen: Approximately 5:15 PM  I have reviewed the triage vital signs and the nursing notes.   HISTORY  Chief Complaint rib pain     HPI Zachary Hartman is a 30 y.o. male , NAD, presents to the emergency department with onset of left sided rib pain since this morning. Notes the pain has subsided since onset and is mild at this time. Believes the pain is related to using Flonase. Has the pain most days he uses the nasal spray. States he was given the prescription from a local community clinic to control his allergies. Denies chest pain, shortness of breath, wheezing, visual changes, weakness. Had a URI about a week ago. Denies rash.    Past Medical History  Diagnosis Date  . Acute pancreatitis 2013    There are no active problems to display for this patient.   History reviewed. No pertinent past surgical history.  Current Outpatient Rx  Name  Route  Sig  Dispense  Refill  . aspirin EC 325 MG tablet   Oral   Take 325-650 mg by mouth daily as needed for pain.         . famotidine (PEPCID) 40 MG tablet   Oral   Take 1 tablet (40 mg total) by mouth every evening.   15 tablet   0   . pantoprazole (PROTONIX) 20 MG tablet   Oral   Take 2 tablets (40 mg total) by mouth daily.   30 tablet   0   . sucralfate (CARAFATE) 1 G tablet   Oral   Take 1 tablet (1 g total) by mouth 2 (two) times daily.   20 tablet   0     Allergies Tylenol and Penicillins  History reviewed. No pertinent family history.  Social History Social History  Substance Use Topics  . Smoking status: Never Smoker   . Smokeless tobacco: Never Used  . Alcohol Use: No     Review of Systems Constitutional: No fever/chills Eyes: No visual changes. No discharge ENT: Positive nasal drainage. No sore throat, ear pain, nasal congestion. Cardiovascular: No chest  pain. Respiratory: No cough. No shortness of breath. No wheezing.  Gastrointestinal: No abdominal pain.  No nausea, vomiting.   Genitourinary: Negative for dysuria. No hematuria. No urinary hesitancy, urgency or increased frequency. Musculoskeletal:  Positive for left lower rib pain. Negative for back, neck pain.  Skin: Negative for rash. Neurological: Negative for headaches, focal weakness or numbness. 10-point ROS otherwise negative.  ____________________________________________   PHYSICAL EXAM:  VITAL SIGNS: ED Triage Vitals  Enc Vitals Group     BP 10/20/15 1703 138/62 mmHg     Pulse Rate 10/20/15 1703 74     Resp 10/20/15 1703 20     Temp 10/20/15 1703 98.2 F (36.8 C)     Temp Source 10/20/15 1703 Oral     SpO2 10/20/15 1703 100 %     Weight 10/20/15 1703 110 lb (49.896 kg)     Height 10/20/15 1703  (1.727 m)     Head Cir --      Peak Flow --      Pain Score 10/20/15 1703 6     Pain Loc --      Pain Edu? --      Excl. in GC? --     Constitutional: Alert and oriented. Well appearing and in no acute distress. Eyes: Conjunctivae are normal. PERRL. EOMI  without pain.  Head: Atraumatic.  Neck: No stridor.  No cervical spine tenderness to palpation. Supple with FROM Hematological/Lymphatic/Immunilogical: No cervical lymphadenopathy. Cardiovascular: Normal rate, regular rhythm. Normal S1 and S2.  Good peripheral circulation. Respiratory: Normal respiratory effort without tachypnea or retractions. Lungs CTAB. Gastrointestinal: Soft and nontender. No distention.  Musculoskeletal: No pain to palpation over the chest, ribcage. No step offs or crepitus to palpation.  Neurologic:  Normal speech and language. No gross focal neurologic deficits are appreciated.  Skin:  Skin is warm, dry and intact. No rash noted. Skin turgor normal. Psychiatric: Mood and affect are normal. Speech and behavior are normal. Patient exhibits appropriate insight and  judgement.   ____________________________________________   LABS  None  ____________________________________________  EKG  None ____________________________________________  RADIOLOGY I have personally viewed and evaluated these images (plain radiographs) as part of my medical decision making, as well as reviewing the written report by the radiologist.  Dg Chest 2 View  10/20/2015  CLINICAL DATA:  Left side rib pain starting 2 p.m. today EXAM: CHEST  2 VIEW COMPARISON:  06/04/2015 FINDINGS: Cardiomediastinal silhouette is stable. No acute infiltrate or pleural effusion. No pulmonary edema. Mild hyperinflation again noted. There is no pneumothorax. IMPRESSION: No active cardiopulmonary disease. Electronically Signed   By: Natasha Mead M.D.   On: 10/20/2015 17:28    ____________________________________________    PROCEDURES  Procedure(s) performed: None    Medications - No data to display   ____________________________________________   INITIAL IMPRESSION / ASSESSMENT AND PLAN / ED COURSE  Pertinent imaging results that were available during my care of the patient were reviewed by me and considered in my medical decision making (see chart for details).   Patient's diagnosis is consistent with costochondral pain. Patient will be discharged home with instructions to avoid use of Flonase and may take Naprosen or Motrin OTC as needed for pain. Patient is to follow up with [facility] if symptoms persist past this treatment course. Patient is given ED precautions to return to the ED for any worsening or new symptoms.    ____________________________________________  FINAL CLINICAL IMPRESSION(S) / ED DIAGNOSES  Final diagnoses:  Costochondral pain      NEW MEDICATIONS STARTED DURING THIS VISIT:  New Prescriptions   No medications on file         Hope Pigeon, PA-C 10/20/15 1809  Rockne Menghini, MD 10/21/15 0025

## 2015-11-19 ENCOUNTER — Ambulatory Visit: Payer: Self-pay

## 2016-02-05 ENCOUNTER — Encounter (HOSPITAL_COMMUNITY): Payer: Self-pay

## 2016-02-05 ENCOUNTER — Emergency Department (HOSPITAL_COMMUNITY)
Admission: EM | Admit: 2016-02-05 | Discharge: 2016-02-05 | Disposition: A | Discharge status: Home / Self Care | Payer: No Typology Code available for payment source | Attending: Emergency Medicine | Admitting: Emergency Medicine

## 2016-02-05 DIAGNOSIS — Z7982 Long term (current) use of aspirin: Secondary | ICD-10-CM | POA: Insufficient documentation

## 2016-02-05 DIAGNOSIS — R42 Dizziness and giddiness: Secondary | ICD-10-CM

## 2016-02-05 LAB — CBC WITH DIFFERENTIAL/PLATELET
Basophils Absolute: 0.2 10*3/uL — ABNORMAL HIGH (ref 0.0–0.1)
Basophils Relative: 1 %
EOS ABS: 0.9 10*3/uL — AB (ref 0.0–0.7)
EOS PCT: 7 %
HCT: 41.4 % (ref 39.0–52.0)
Hemoglobin: 14.3 g/dL (ref 13.0–17.0)
LYMPHS ABS: 4 10*3/uL (ref 0.7–4.0)
LYMPHS PCT: 30 %
MCH: 28.8 pg (ref 26.0–34.0)
MCHC: 34.5 g/dL (ref 30.0–36.0)
MCV: 83.5 fL (ref 78.0–100.0)
MONO ABS: 1.3 10*3/uL — AB (ref 0.1–1.0)
MONOS PCT: 10 %
Neutro Abs: 7 10*3/uL (ref 1.7–7.7)
Neutrophils Relative %: 52 %
PLATELETS: 238 10*3/uL (ref 150–400)
RBC: 4.96 MIL/uL (ref 4.22–5.81)
RDW: 12.6 % (ref 11.5–15.5)
WBC: 13.4 10*3/uL — ABNORMAL HIGH (ref 4.0–10.5)

## 2016-02-05 LAB — URINALYSIS, ROUTINE W REFLEX MICROSCOPIC
BILIRUBIN URINE: NEGATIVE
GLUCOSE, UA: NEGATIVE mg/dL
HGB URINE DIPSTICK: NEGATIVE
Ketones, ur: NEGATIVE mg/dL
Leukocytes, UA: NEGATIVE
Nitrite: NEGATIVE
PROTEIN: NEGATIVE mg/dL
pH: 6 (ref 5.0–8.0)

## 2016-02-05 LAB — BASIC METABOLIC PANEL
Anion gap: 7 (ref 5–15)
BUN: 9 mg/dL (ref 6–20)
CO2: 29 mmol/L (ref 22–32)
CREATININE: 1 mg/dL (ref 0.61–1.24)
Calcium: 9 mg/dL (ref 8.9–10.3)
Chloride: 101 mmol/L (ref 101–111)
GFR calc Af Amer: >60 mL/min (ref >60)
GLUCOSE: 84 mg/dL (ref 65–99)
POTASSIUM: 4 mmol/L (ref 3.5–5.1)
Sodium: 137 mmol/L (ref 135–145)

## 2016-02-05 MED ORDER — SODIUM CHLORIDE 0.9 % IV BOLUS (SEPSIS)
1000.0000 mL | Freq: Once | INTRAVENOUS | Status: AC
  Administered 2016-02-05: 1000 mL via INTRAVENOUS

## 2016-02-05 NOTE — Discharge Instructions (Signed)

## 2016-02-05 NOTE — ED Notes (Signed)
Discharge when fluids are complete.

## 2016-02-05 NOTE — ED Provider Notes (Signed)
TIME SEEN: 5:30 AM  CHIEF COMPLAINT: Dizziness  HPI: Pt is a 30 y.o. male with no significant past medical history who presents emergency department with lightheadedness worse with standing for the past several days. States he has had similar symptoms before and he thinks is because he has not been drinking much water and has been drinking a large amount of [employer]. States that when he feels dizzy, it begins to make him feel like he can't catch his breath and that he is "quivering" all over. States it makes him feel very anxious. No chest pain or chest discomfort. No shortness of breath. No nausea, vomiting or diarrhea. No bloody stools or melena. No numbness, tingling or focal weakness. No headache.  No history of PE, DVT, exogenous estrogen use, fracture, surgery, trauma, hospitalization, prolonged travel. No lower extremity swelling or pain. No calf tenderness.   ROS: See HPI Constitutional: no fever  Eyes: no drainage  ENT: no runny nose   Cardiovascular:  no chest pain  Resp: no SOB  GI: no vomiting GU: no dysuria Integumentary: no rash  Allergy: no hives  Musculoskeletal: no leg swelling  Neurological: no slurred speech ROS otherwise negative  PAST MEDICAL HISTORY/PAST SURGICAL HISTORY:  Past Medical History  Diagnosis Date  . Acute pancreatitis 2013    MEDICATIONS:  Prior to Admission medications   Medication Sig Start Date End Date Taking? Authorizing Provider  aspirin EC 325 MG tablet Take 325-650 mg by mouth daily as needed for pain.   Yes Historical Provider, MD  famotidine (PEPCID) 40 MG tablet Take 1 tablet (40 mg total) by mouth every evening. 08/16/15 08/15/16  [name], MD  pantoprazole (PROTONIX) 20 MG tablet Take 2 tablets (40 mg total) by mouth daily. 06/04/15   Richardean Canal, MD  sucralfate (CARAFATE) 1 G tablet Take 1 tablet (1 g total) by mouth 2 (two) times daily. 08/16/15   [name], MD    ALLERGIES:  Allergies  Allergen Reactions  .  Tylenol [Acetaminophen] Nausea Only  . Penicillins Hives    SOCIAL HISTORY:  Social History  Substance Use Topics  . Smoking status: Never Smoker   . Smokeless tobacco: Never Used  . Alcohol Use: No    FAMILY HISTORY: No family history on file.  EXAM: BP 135/85 mmHg  Pulse 77  Temp(Src) 98 F (36.7 C) (Oral)  Resp 20  Ht  (1.727 m)  Wt 110 lb (49.896 kg)  BMI 16.73 kg/m2  SpO2 100% CONSTITUTIONAL: Alert and oriented and responds appropriately to questions. Well-appearing; well-nourished HEAD: Normocephalic EYES: Conjunctivae clear, PERRL ENT: normal nose; no rhinorrhea; Slightly dry mucous membranes NECK: Supple, no meningismus, no LAD  CARD: RRR; S1 and S2 appreciated; no murmurs, no clicks, no rubs, no gallops RESP: Normal chest excursion without splinting or tachypnea; breath sounds clear and equal bilaterally; no wheezes, no rhonchi, no rales, no hypoxia or respiratory distress, speaking full sentences ABD/GI: Normal bowel sounds; non-distended; soft, non-tender, no rebound, no guarding, no peritoneal signs BACK:  The back appears normal and is non-tender to palpation, there is no CVA tenderness EXT: Normal ROM in all joints; non-tender to palpation; no edema; normal capillary refill; no cyanosis, no calf tenderness or swelling    SKIN: Normal color for age and race; warm; no rash NEURO: Moves all extremities equally, sensation to light touch intact diffusely, cranial nerves II through XII intact, normal gait PSYCH: The patient's mood and manner are appropriate. Grooming and personal  hygiene are appropriate.  MEDICAL DECISION MAKING: Patient here with what sounds like Orthostasis. Will check labs to ensure that there is no anemia, electrolyte abnormality. Doubt ACS or pulmonary embolus. He has no chest pain or shortness of breath currently. EKG shows what appears to be early repolarization that is similar to prior EKGs. We'll give IV fluids, encourage oral fluids as  well. We'll check orthostatic vital signs.  ED PROGRESS: Patient reports feeling better after IV fluids. Labs show mild leukocytosis which appears to be chronic for patient. No ketones in his urine is having infection. Discussed with patient that he should increase his water intake, get up from a seating or lying position slowly. Orthostatic vital signs are negative but he is symptomatic when he stands. States he has had this several times in the past. Doubt any life-threatening illness. I feel he is safe to be discharged. [place]. [place] in [place]. We'll give him PCP follow-up here in [place]. Discussed return precautions. Patient and his significant other at bedside verbalize understanding and are comfortable with this plan.    At this time, I do not feel there is any life-threatening condition present. I have reviewed and discussed all results (EKG, imaging, lab, urine as appropriate), exam findings with patient. I have reviewed nursing notes and appropriate previous records.  I feel the patient is safe to be discharged home without further emergent workup. Discussed usual and customary return precautions. Patient and family (if present) verbalize understanding and are comfortable with this plan.  Patient will follow-up with their primary care provider. If they do not have a primary care provider, information for follow-up has been provided to them. All questions have been answered.     EKG Interpretation  Date/Time:  Friday February 05 2016 [id number]:25 EDT Ventricular Rate:  72 PR Interval:  144 QRS Duration: 85 QT Interval:  369 QTC Calculation: 404 R Axis:   90 Text Interpretation:  Sinus rhythm Borderline right axis deviation ST elev, probable normal early repol pattern Baseline wander in lead(s) III No significant change since 2013 Confirmed by WARD,  DO, KRISTEN [id number]) on 02/05/2016 5:30:47 AM        Layla MawKristen N Ward, DO 02/05/16 [id number]

## 2016-02-05 NOTE — ED Notes (Signed)
Pt states he has not been feeling good x 1 hour, states he looked up some things on the internet and felt he should get checked out.  Pt denies pain or breathing problems, states sometimes when he stands up he feels weak, states he has been worried about his blood pressure and that he may be dehydrated.

## 2016-02-05 NOTE — ED Notes (Signed)
Pt is anxious, ambulatory to the bathroom, did not get sample.

## 2016-02-18 ENCOUNTER — Ambulatory Visit: Payer: Self-pay | Admitting: Physician Assistant

## 2016-02-18 ENCOUNTER — Encounter: Payer: Self-pay | Admitting: Physician Assistant

## 2016-02-18 VITALS — BP 96/60 | HR 64 | Temp 98.1°F | Ht 66.25 in | Wt 102.0 lb

## 2016-02-18 DIAGNOSIS — Z13220 Encounter for screening for lipoid disorders: Secondary | ICD-10-CM

## 2016-02-18 DIAGNOSIS — Z131 Encounter for screening for diabetes mellitus: Secondary | ICD-10-CM

## 2016-02-18 DIAGNOSIS — K219 Gastro-esophageal reflux disease without esophagitis: Secondary | ICD-10-CM

## 2016-02-18 DIAGNOSIS — R1013 Epigastric pain: Secondary | ICD-10-CM

## 2016-02-18 LAB — GLUCOSE, POCT (MANUAL RESULT ENTRY): POC Glucose: 103 mg/dl — AB (ref 70–99)

## 2016-02-18 MED ORDER — RANITIDINE HCL 300 MG PO TABS: 300.0000 mg | ORAL_TABLET | Freq: Every day | ORAL | Status: DC

## 2016-02-18 NOTE — Progress Notes (Signed)
BP 96/60 mmHg  Pulse 64  Temp(Src) 98.1 F (36.7 C)  Ht 5' 6.25" (1.683 m)  Wt 102 lb (46.267 kg)  BMI 16.33 kg/m2  SpO2 98%   Subjective:    Patient ID: Zachary BeardsNathaniel J Hartman, male    DOB: [birth year], 30 y.o.   MRN: [id number]  HPI:  Zachary Hartman is a 30 y.o. male presenting on 02/18/2016 for New Patient (Initial Visit); Chest Pain; and Dental Pain   HPI  Chief Complaint  Patient presents with  . New Patient (Initial Visit)    pt [place] going [place] clinic in [place] and decides to swith [facility] because it is closer to his home  . Chest Pain    pt state it is mild CP where his heart feels weak, feels mild dizziness, feels like he is going to faint  . Dental Pain    has not seen dentist for about 4 years     [facility].  He says he moved here recently  Pt admits to being "stressed out" but thinks he is more relaxed since moving to [place].    Pt states he really counldn't tell any differece with stomach meds but he says he only had one stomach med  Pt states was weighing 110 pounds at christmas  Relevant past medical, surgical, family and social history reviewed and updated as indicated. Interim medical history since our last visit reviewed. Allergies and medications reviewed and updated.  CURRENT MEDS: Asa 325mg  1 qd  Review of Systems  Constitutional: Positive for appetite change. Negative for fever, chills, diaphoresis, fatigue and unexpected weight change.  HENT: Positive for dental problem, ear pain and sneezing. Negative for congestion, drooling, facial swelling, hearing loss, mouth sores, sore throat, trouble swallowing and voice change.   Eyes: Positive for redness and itching. Negative for pain, discharge and visual disturbance.  Respiratory: Positive for shortness of breath. Negative for cough, choking and wheezing.   Cardiovascular: Negative for chest pain, palpitations and leg swelling.  Gastrointestinal: Positive for  abdominal pain. Negative for vomiting, diarrhea, constipation and blood in stool.  Endocrine: Positive for cold intolerance. Negative for heat intolerance and polydipsia.  Genitourinary: Negative for dysuria, hematuria and decreased urine volume.  Musculoskeletal: Positive for back pain. Negative for arthralgias and gait problem.  Skin: Negative for rash.  Allergic/Immunologic: Positive for environmental allergies.  Neurological: Positive for light-headedness and headaches. Negative for seizures and syncope.  Hematological: Negative for adenopathy.  Psychiatric/Behavioral: Positive for agitation. Negative for suicidal ideas and dysphoric mood. The patient is not nervous/anxious.     Per HPI unless specifically indicated above     Objective:    BP 96/60 mmHg  Pulse 64  Temp(Src) 98.1 F (36.7 C)  Ht 5' 6.25" (1.683 m)  Wt 102 lb (46.267 kg)  BMI 16.33 kg/m2  SpO2 98%  Wt Readings from Last 3 Encounters:  02/18/16 102 lb (46.267 kg)  02/05/16 110 lb (49.896 kg)  10/20/15 110 lb (49.896 kg)    Physical Exam  Constitutional: He is oriented to person, place, and time. He appears well-developed and well-nourished.  HENT:  Head: Normocephalic and atraumatic.  Mouth/Throat: Oropharynx is clear and moist. No oropharyngeal exudate.  Eyes: Conjunctivae and EOM are normal. Pupils are equal, round, and reactive to light.  Neck: Neck supple. No thyromegaly present.  Cardiovascular: Normal rate and regular rhythm.   Pulmonary/Chest: Effort normal and breath sounds normal. He has no wheezes. He has  no rales.  Abdominal: Soft. Bowel sounds are normal. He exhibits no mass. There is no hepatosplenomegaly. There is generalized tenderness. There is no rigidity, no rebound and no guarding.  Musculoskeletal: He exhibits no edema.  Lymphadenopathy:    He has no cervical adenopathy.  Neurological: He is alert and oriented to person, place, and time.  Skin: Skin is warm and dry. No rash noted.   Psychiatric: He has a normal mood and affect. His behavior is normal. Thought content normal.  Vitals reviewed.   Results for orders placed or performed in visit on 02/18/16  POCT Glucose (CBG)  Result Value Ref Range   POC Glucose 103 (A) 70 - 99 mg/dl      Assessment & Plan:    Encounter Diagnoses  Name Primary?  . Gastroesophageal reflux disease, esophagitis presence not specified Yes  . Abdominal pain, epigastric   . Screening for diabetes mellitus   . Screening cholesterol level     -Check lipids, h pylori.  -Gave cardinal card and recommended he call for counseling -rx ranitidine -Gave contact information for dental -F/u 1 month. RTO sooner prn worsening or new symptoms

## 2016-02-19 DIAGNOSIS — R1013 Epigastric pain: Secondary | ICD-10-CM | POA: Insufficient documentation

## 2016-02-19 DIAGNOSIS — K219 Gastro-esophageal reflux disease without esophagitis: Secondary | ICD-10-CM | POA: Insufficient documentation

## 2016-02-20 LAB — CBC WITH DIFFERENTIAL/PLATELET
BASOS ABS: 255 {cells}/uL — AB (ref 0–200)
Basophils Relative: 3 %
EOS PCT: 8 %
Eosinophils Absolute: 680 cells/uL — ABNORMAL HIGH (ref 15–500)
HCT: 43.8 % (ref 38.5–50.0)
HEMOGLOBIN: 15.2 g/dL (ref 13.2–17.1)
LYMPHS ABS: 1955 {cells}/uL (ref 850–3900)
Lymphocytes Relative: 23 %
MCH: 28.5 pg (ref 27.0–33.0)
MCHC: 34.7 g/dL (ref 32.0–36.0)
MCV: 82 fL (ref 80.0–100.0)
MONOS PCT: 8 %
MPV: 9.1 fL (ref 7.5–12.5)
Monocytes Absolute: 680 cells/uL (ref 200–950)
NEUTROS ABS: 4930 {cells}/uL (ref 1500–7800)
NEUTROS PCT: 58 %
PLATELETS: 252 10*3/uL (ref 140–400)
RBC: 5.34 MIL/uL (ref 4.20–5.80)
RDW: 13.8 % (ref 11.0–15.0)
WBC: 8.5 10*3/uL (ref 3.8–10.8)

## 2016-02-20 LAB — LIPID PANEL
CHOL/HDL RATIO: 2.9 ratio (ref <=5.0)
CHOLESTEROL: 134 mg/dL (ref 125–200)
HDL: 47 mg/dL (ref >=40)
LDL CALC: 79 mg/dL (ref <130)
Triglycerides: 38 mg/dL (ref <150)
VLDL: 8 mg/dL (ref <30)

## 2016-02-20 LAB — LIPASE: Lipase: 49 U/L (ref 7–60)

## 2016-02-22 LAB — H. PYLORI BREATH TEST: H. pylori Breath Test: DETECTED — AB

## 2016-03-10 ENCOUNTER — Ambulatory Visit: Payer: Self-pay | Admitting: Physician Assistant

## 2016-03-10 ENCOUNTER — Encounter: Payer: Self-pay | Admitting: Physician Assistant

## 2016-03-10 VITALS — BP 92/64 | HR 72 | Temp 98.1°F | Ht 66.25 in | Wt 99.4 lb

## 2016-03-10 DIAGNOSIS — A048 Other specified bacterial intestinal infections: Secondary | ICD-10-CM | POA: Insufficient documentation

## 2016-03-10 DIAGNOSIS — K219 Gastro-esophageal reflux disease without esophagitis: Secondary | ICD-10-CM

## 2016-03-10 DIAGNOSIS — R636 Underweight: Secondary | ICD-10-CM

## 2016-03-10 DIAGNOSIS — F419 Anxiety disorder, unspecified: Secondary | ICD-10-CM | POA: Insufficient documentation

## 2016-03-10 NOTE — Progress Notes (Signed)
BP 92/64 mmHg  Pulse 72  Temp(Src) 98.1 F (36.7 C)  Ht 5' 6.25" (1.683 m)  Wt 99 lb 6.4 oz (45.088 kg)  BMI 15.92 kg/m2  SpO2 97%   Subjective:    Patient ID: Zachary Hartman, male    DOB: 05-Sep-1985, 30 y.o.   MRN: [id number]  HPI: Zachary Hartman is a 30 y.o. male presenting on 03/10/2016 for Follow-up   HPI   Pt did not contact MH.  Pt has noticed improvement in his symptoms but he is only taking the ranitidine every other night.  He hasn't been taking it every night b/c he is worried about side effects  Pt is only eating twice/day  Relevant past medical, surgical, family and social history reviewed and updated as indicated. Interim medical history since our last visit reviewed. Allergies and medications reviewed and updated.   Current outpatient prescriptions:  .  aspirin EC 325 MG tablet, Take 325-650 mg by mouth daily as needed for pain. Reported on 02/18/2016, Disp: , Rfl:  .  ranitidine (ZANTAC) 300 MG tablet, Take 1 tablet (300 mg total) by mouth at bedtime., Disp: 30 tablet, Rfl: 1   Review of Systems  Constitutional: Positive for appetite change and fatigue. Negative for fever, chills, diaphoresis and unexpected weight change.  HENT: Positive for dental problem and sneezing. Negative for congestion, drooling, ear pain, facial swelling, hearing loss, mouth sores, sore throat, trouble swallowing and voice change.   Eyes: Negative for pain, discharge, redness, itching and visual disturbance.  Respiratory: Negative for cough, choking, shortness of breath and wheezing.   Cardiovascular: Negative for chest pain, palpitations and leg swelling.  Gastrointestinal: Negative for vomiting, abdominal pain, diarrhea, constipation and blood in stool.  Endocrine: Positive for heat intolerance. Negative for cold intolerance and polydipsia.  Genitourinary: Negative for dysuria, hematuria and decreased urine volume.  Musculoskeletal: Negative for back pain, arthralgias and  gait problem.  Skin: Negative for rash.  Allergic/Immunologic: Negative for environmental allergies.  Neurological: Negative for seizures, syncope, light-headedness and headaches.  Hematological: Negative for adenopathy.  Psychiatric/Behavioral: Negative for suicidal ideas, dysphoric mood and agitation. The patient is not nervous/anxious.     Per HPI unless specifically indicated above     Objective:    BP 92/64 mmHg  Pulse 72  Temp(Src) 98.1 F (36.7 C)  Ht 5' 6.25" (1.683 m)  Wt 99 lb 6.4 oz (45.088 kg)  BMI 15.92 kg/m2  SpO2 97%  Wt Readings from Last 3 Encounters:  03/10/16 99 lb 6.4 oz (45.088 kg)  08/25/15 105 lb (47.628 kg)  02/18/16 102 lb (46.267 kg)    Physical Exam  Constitutional: He is oriented to person, place, and time. He appears well-developed and well-nourished.  HENT:  Head: Normocephalic and atraumatic.  Neck: Neck supple.  Cardiovascular: Normal rate and regular rhythm.   Pulmonary/Chest: Effort normal and breath sounds normal. He has no wheezes.  Abdominal: Soft. Bowel sounds are normal. There is no hepatosplenomegaly. There is no tenderness.  Musculoskeletal: He exhibits no edema.  Lymphadenopathy:    He has no cervical adenopathy.  Neurological: He is alert and oriented to person, place, and time.  Skin: Skin is warm and dry.  Psychiatric: He has a normal mood and affect. His behavior is normal.  Vitals reviewed.       Assessment & Plan:   Encounter Diagnoses  Name Primary?  . H. pylori infection Yes  . Gastroesophageal reflux disease, esophagitis presence not specified   . Underweight   .  Anxiety     -urged pt to Contact MH ASAP to get help with his anxiety -Reviewed labs with pt -discussed nutrition with pt and encouraged him to eat at least 3 meals daily and several snacks throughout the day.  recommended ensure.  Refer to nutritionist.  Suspect weight loss is due to combination of anxiety and gerd/h pylori -treat h pylori with  pylera.  Cannot use amoxil regimin in light of pt's allergy.  Cost of tetracycline regimin is prohibitive.  Found patient assistance program for pylera.  Will order this for pt -F/u 2 months

## 2016-03-14 ENCOUNTER — Other Ambulatory Visit: Payer: Self-pay | Admitting: Physician Assistant

## 2016-03-17 ENCOUNTER — Ambulatory Visit: Payer: Self-pay | Admitting: Physician Assistant

## 2016-05-11 ENCOUNTER — Ambulatory Visit: Payer: Self-pay | Admitting: Physician Assistant

## 2016-05-11 ENCOUNTER — Encounter: Payer: Self-pay | Admitting: Physician Assistant

## 2016-05-11 VITALS — BP 110/68 | HR 86 | Temp 98.1°F | Ht 66.25 in | Wt 102.8 lb

## 2016-05-11 DIAGNOSIS — K219 Gastro-esophageal reflux disease without esophagitis: Secondary | ICD-10-CM

## 2016-05-11 DIAGNOSIS — R636 Underweight: Secondary | ICD-10-CM

## 2016-05-11 DIAGNOSIS — A048 Other specified bacterial intestinal infections: Secondary | ICD-10-CM

## 2016-05-11 DIAGNOSIS — R1013 Epigastric pain: Secondary | ICD-10-CM

## 2016-05-11 NOTE — Progress Notes (Signed)
BP 110/68 (BP Location: Left Arm, Patient Position: Sitting, Cuff Size: Small)   Pulse 86   Temp 98.1 F (36.7 C) (Other (Comment))   Ht 5' 6.25" (1.683 m)   Wt 102 lb 12.8 oz (46.6 kg)   SpO2 97%   BMI 16.47 kg/m    Subjective:    Patient ID: Zachary Hartman, male    DOB: Dec 27, 1985, 30 y.o.   MRN: [id number]  HPI: Zachary Hartman is a 30 y.o. male presenting on 05/11/2016 for Gastroesophageal Reflux; Weight Check; and GI Problem (H. pylori infection)   HPI   Pt didn't get pylera yet b/c he didn't bring in paperwork for nurse (to request medication through pt assistance program).  He went to [facility] in July twice.  He hasn't been back but says he is in a better place now and isn't feeling depression or anxiety.  He is up 3 pound since last OV  Pt states some days stomach is okay and other days it hurts  Pt has left employment at [employer].  He starts new job at [employer] in [place] on Friday.    Relevant past medical, surgical, family and social history reviewed and updated as indicated. Interim medical history since our last visit reviewed. Allergies and medications reviewed and updated.  No current outpatient prescriptions on file.   Review of Systems  Constitutional: Positive for appetite change. Negative for fatigue and fever.  HENT: Negative for congestion and mouth sores.   Eyes: Negative for redness and itching.  Respiratory: Negative for cough, shortness of breath and wheezing.   Gastrointestinal: Positive for abdominal pain.  Endocrine: Negative for cold intolerance, heat intolerance and polydipsia.  Genitourinary: Negative for dysuria and hematuria.  Musculoskeletal: Negative for arthralgias, back pain and gait problem.  Skin: Negative for rash.  Allergic/Immunologic: Negative for environmental allergies.  Neurological: Positive for headaches. Negative for seizures and syncope.  Hematological: Negative for adenopathy.  Psychiatric/Behavioral:  Positive for agitation. Negative for dysphoric mood and suicidal ideas. The patient is not nervous/anxious.     Per HPI unless specifically indicated above     Objective:    BP 110/68 (BP Location: Left Arm, Patient Position: Sitting, Cuff Size: Small)   Pulse 86   Temp 98.1 F (36.7 C) (Other (Comment))   Ht 5' 6.25" (1.683 m)   Wt 102 lb 12.8 oz (46.6 kg)   SpO2 97%   BMI 16.47 kg/m   Wt Readings from Last 3 Encounters:  05/11/16 102 lb 12.8 oz (46.6 kg)  03/10/16 99 lb 6.4 oz (45.1 kg)  08/25/15 105 lb (47.6 kg)    Physical Exam  Constitutional: He is oriented to person, place, and time. He appears well-developed and well-nourished.  HENT:  Head: Normocephalic and atraumatic.  Neck: Neck supple.  Cardiovascular: Normal rate and regular rhythm.   Pulmonary/Chest: Effort normal and breath sounds normal. He has no wheezes.  Abdominal: Soft. Bowel sounds are normal. There is no hepatosplenomegaly. There is no tenderness.  Musculoskeletal: He exhibits no edema.  Lymphadenopathy:    He has no cervical adenopathy.  Neurological: He is alert and oriented to person, place, and time.  Skin: Skin is warm and dry.  Psychiatric: He has a normal mood and affect. His behavior is normal.  Vitals reviewed.       Assessment & Plan:   Encounter Diagnoses  Name Primary?  . Abdominal pain, epigastric Yes  . H. pylori infection   . Gastroesophageal reflux disease, esophagitis  presence not specified   . Underweight      -pt urged to get paperwork in to get his pylera.  Once his h pylori gets treated, his abdominal pain should improve and this will help him get back to eating more normally -counseled to eat to get weight back up.  Will try to get pt in with nutritionist to help with this -f/u 6 weeks to check on his gerd/abdominal pain and weight.  He is to RTO sooner if needed

## 2016-05-12 ENCOUNTER — Other Ambulatory Visit: Payer: Self-pay | Admitting: Physician Assistant

## 2016-05-12 ENCOUNTER — Ambulatory Visit: Payer: Self-pay | Admitting: Nutrition

## 2016-05-12 MED ORDER — OMEPRAZOLE 20 MG PO CPDR: 20.0000 mg | DELAYED_RELEASE_CAPSULE | Freq: Two times a day (BID) | ORAL | 0 refills | Status: DC

## 2016-05-12 MED ORDER — BIS SUBCIT-METRONID-TETRACYC 140-125-125 MG PO CAPS: 3.0000 | ORAL_CAPSULE | Freq: Three times a day (TID) | ORAL | 0 refills | Status: DC

## 2016-06-22 ENCOUNTER — Encounter: Payer: Self-pay | Admitting: Physician Assistant

## 2016-06-22 ENCOUNTER — Ambulatory Visit: Payer: Self-pay | Admitting: Physician Assistant

## 2016-06-22 VITALS — BP 92/66 | HR 76 | Temp 97.7°F | Wt 105.0 lb

## 2016-06-22 DIAGNOSIS — R636 Underweight: Secondary | ICD-10-CM

## 2016-06-22 DIAGNOSIS — A048 Other specified bacterial intestinal infections: Secondary | ICD-10-CM

## 2016-06-22 DIAGNOSIS — R1013 Epigastric pain: Secondary | ICD-10-CM

## 2016-06-22 NOTE — Progress Notes (Signed)
BP 92/66 (BP Location: Left Arm, Patient Position: Sitting, Cuff Size: Normal)   Pulse 76   Temp 97.7 F (36.5 C)   Wt 105 lb (47.6 kg)   SpO2 99%   BMI 16.82 kg/m    Subjective:    Patient ID: Zachary Hartman, male    DOB: [birth year], 30 y.o.   MRN: [id number]  HPI: Zachary Hartman is a 30 y.o. male presenting on 06/22/2016 for Gastroesophageal Reflux and Weight Check   HPI   He is on day 6 of his pylera.  The first 3 days he only took it tid instead of qid. He has only been using the omeprazole qd.   He has gained a few pounds and says he hasn't been having as much problems with eating.   Relevant past medical, surgical, family and social history reviewed and updated as indicated. Interim medical history since our last visit reviewed. Allergies and medications reviewed and updated.   Current Outpatient Prescriptions:  .  bismuth-metronidazole-tetracycline (PYLERA) 140-125-125 MG capsule, Take 3 capsules by mouth 4 (four) times daily -  before meals and at bedtime., Disp: 120 capsule, Rfl: 0 .  omeprazole (PRILOSEC) 20 MG capsule, Take 1 capsule (20 mg total) by mouth 2 (two) times daily before a meal. (Patient taking differently: Take 20 mg by mouth daily. ), Disp: 20 capsule, Rfl: 0   Review of Systems  Constitutional: Positive for appetite change. Negative for chills, diaphoresis, fatigue, fever and unexpected weight change.  HENT: Positive for dental problem. Negative for congestion, drooling, ear pain, facial swelling, hearing loss, mouth sores, sneezing, sore throat, trouble swallowing and voice change.   Eyes: Positive for redness. Negative for pain, discharge, itching and visual disturbance.  Respiratory: Negative for cough, choking, shortness of breath and wheezing.   Cardiovascular: Negative for chest pain, palpitations and leg swelling.  Gastrointestinal: Positive for abdominal pain. Negative for blood in stool, constipation, diarrhea and vomiting.   Endocrine: Negative for cold intolerance, heat intolerance and polydipsia.  Genitourinary: Negative for decreased urine volume, dysuria and hematuria.  Musculoskeletal: Positive for back pain. Negative for arthralgias and gait problem.  Skin: Negative for rash.  Allergic/Immunologic: Negative for environmental allergies.  Neurological: Negative for seizures, syncope, light-headedness and headaches.  Hematological: Negative for adenopathy.  Psychiatric/Behavioral: Negative for agitation, dysphoric mood and suicidal ideas. The patient is not nervous/anxious.     Per HPI unless specifically indicated above     Objective:    BP 92/66 (BP Location: Left Arm, Patient Position: Sitting, Cuff Size: Normal)   Pulse 76   Temp 97.7 F (36.5 C)   Wt 105 lb (47.6 kg)   SpO2 99%   BMI 16.82 kg/m   Wt Readings from Last 3 Encounters:  06/22/16 105 lb (47.6 kg)  05/11/16 102 lb 12.8 oz (46.6 kg)  03/10/16 99 lb 6.4 oz (45.1 kg)    Physical Exam  Constitutional: He is oriented to person, place, and time. He appears well-developed and well-nourished.  HENT:  Head: Normocephalic and atraumatic.  Neck: Neck supple.  Cardiovascular: Normal rate and regular rhythm.   Pulmonary/Chest: Effort normal and breath sounds normal. He has no wheezes.  Abdominal: Soft. Bowel sounds are normal. There is no hepatosplenomegaly. There is no tenderness.  Musculoskeletal: He exhibits no edema.  Lymphadenopathy:    He has no cervical adenopathy.  Neurological: He is alert and oriented to person, place, and time.  Skin: Skin is warm and dry.  Psychiatric: He has a  normal mood and affect. His behavior is normal.  Vitals reviewed.       Assessment & Plan:    Encounter Diagnoses  Name Primary?  . Abdominal pain, epigastric Yes  . H. pylori infection   . Underweight     -Counseled pt to take omeprazole bid as prescribed.   -discussed with pt the importance of taking his pylera exactly as directed.   He said that he would -Will try to reschedule his appointment with the dietician as he was a no-show to the appointment that we made for him -follow up in 6 weeks for recheck.  RTO sooner prn

## 2016-06-23 ENCOUNTER — Telehealth: Payer: Self-pay | Admitting: Student

## 2016-06-23 NOTE — Telephone Encounter (Signed)
Pt has appointment scheduled for Tues. Nov. 7, 2017 @ 1:30

## 2016-06-23 NOTE — Telephone Encounter (Signed)
Called pt to notify him of appt, but there was a busy signal. Will try again later.

## 2016-06-23 NOTE — Telephone Encounter (Signed)
-----   Message from Jacquelin HawkingShannon McElroy, [place] sent at 06/22/2016 10:55 AM EDT ----- Please see if you can r/s appt with dietician. thanks

## 2016-06-27 NOTE — Telephone Encounter (Signed)
Busy signal. Will try again.

## 2016-07-04 NOTE — Telephone Encounter (Signed)
Could not get in touch with patient to notify him of appt. Appt with Boyd Kerbsenny has been cancelled and can be rescheduled when pt is in the office.

## 2016-07-05 ENCOUNTER — Ambulatory Visit: Payer: Self-pay | Admitting: Nutrition

## 2016-08-03 ENCOUNTER — Ambulatory Visit: Payer: Self-pay | Admitting: Physician Assistant

## 2016-08-03 ENCOUNTER — Encounter: Payer: Self-pay | Admitting: Physician Assistant

## 2016-08-03 VITALS — BP 108/68 | HR 78 | Temp 97.9°F | Wt 102.8 lb

## 2016-08-03 DIAGNOSIS — K219 Gastro-esophageal reflux disease without esophagitis: Secondary | ICD-10-CM

## 2016-08-03 DIAGNOSIS — R636 Underweight: Secondary | ICD-10-CM

## 2016-08-03 MED ORDER — PANTOPRAZOLE SODIUM 40 MG PO TBEC: 40.0000 mg | DELAYED_RELEASE_TABLET | Freq: Every day | ORAL | 3 refills | Status: DC

## 2016-08-03 NOTE — Progress Notes (Signed)
   BP 108/68 (BP Location: Left Arm, Patient Position: Sitting, Cuff Size: Normal)   Pulse 78   Temp 97.9 F (36.6 C)   Wt 102 lb 12 oz (46.6 kg)   SpO2 99%   BMI 16.46 kg/m    Subjective:    Patient ID: Zachary Hartman, male    DOB: 1986-03-06, 30 y.o.   MRN: [id number]  HPI: Zachary Beardsathaniel J Hinnant is a 30 y.o. male presenting on 08/03/2016 for Gastroesophageal Reflux and Weight Check   HPI  Pt completed his pylera.  Says he felt better but says he sometimes still has pains and feels like he needs some medicine.   Pt has still not been to see nutritionist  Pt had negative HIV test in  few weeks ago  Relevant past medical, surgical, family and social history reviewed and updated as indicated. Interim medical history since our last visit reviewed. Allergies and medications reviewed and updated.  No current outpatient prescriptions on file.   Review of Systems  Constitutional: Negative for appetite change, chills, diaphoresis, fatigue, fever and unexpected weight change.  HENT: Positive for dental problem and sneezing. Negative for congestion, drooling, ear pain, facial swelling, hearing loss, mouth sores, sore throat, trouble swallowing and voice change.   Eyes: Negative for pain, discharge, redness, itching and visual disturbance.  Respiratory: Negative for cough, choking, shortness of breath and wheezing.   Cardiovascular: Negative for chest pain, palpitations and leg swelling.  Gastrointestinal: Negative for abdominal pain, blood in stool, constipation, diarrhea and vomiting.  Endocrine: Negative for cold intolerance, heat intolerance and polydipsia.  Genitourinary: Negative for decreased urine volume, dysuria and hematuria.  Musculoskeletal: Negative for arthralgias, back pain and gait problem.  Skin: Negative for rash.  Allergic/Immunologic: Negative for environmental allergies.  Neurological: Positive for headaches. Negative for seizures, syncope and  light-headedness.  Hematological: Negative for adenopathy.  Psychiatric/Behavioral: Positive for agitation. Negative for dysphoric mood and suicidal ideas. The patient is not nervous/anxious.     Per HPI unless specifically indicated above     Objective:    BP 108/68 (BP Location: Left Arm, Patient Position: Sitting, Cuff Size: Normal)   Pulse 78   Temp 97.9 F (36.6 C)   Wt 102 lb 12 oz (46.6 kg)   SpO2 99%   BMI 16.46 kg/m   Wt Readings from Last 3 Encounters:  08/03/16 102 lb 12 oz (46.6 kg)  06/22/16 105 lb (47.6 kg)  05/11/16 102 lb 12.8 oz (46.6 kg)    Physical Exam  Constitutional: He is oriented to person, place, and time. Vital signs are normal.  Very thin  HENT:  Head: Normocephalic and atraumatic.  Neck: Neck supple.  Cardiovascular: Normal rate and regular rhythm.   Pulmonary/Chest: Effort normal and breath sounds normal. He has no wheezes.  Abdominal: Soft. Bowel sounds are normal. There is no hepatosplenomegaly. There is no tenderness.  Musculoskeletal: He exhibits no edema.  Lymphadenopathy:    He has no cervical adenopathy.  Neurological: He is alert and oriented to person, place, and time.  Skin: Skin is warm and dry.  Psychiatric: He has a normal mood and affect. His behavior is normal.  Vitals reviewed.       Assessment & Plan:   Encounter Diagnoses  Name Primary?  . Gastroesophageal reflux disease, esophagitis presence not specified Yes  . Underweight      -rx protonix (gave coupon) -schedule with Nutritionist -F/u 1 month to recheck gerd. RTO sooner prn

## 2016-08-09 ENCOUNTER — Encounter: Payer: Self-pay | Admitting: Nutrition

## 2016-08-09 ENCOUNTER — Encounter: Payer: Self-pay | Attending: Physician Assistant | Admitting: Nutrition

## 2016-08-09 VITALS — Wt 102.0 lb

## 2016-08-09 DIAGNOSIS — Z713 Dietary counseling and surveillance: Secondary | ICD-10-CM | POA: Insufficient documentation

## 2016-08-09 DIAGNOSIS — R636 Underweight: Secondary | ICD-10-CM | POA: Insufficient documentation

## 2016-08-09 DIAGNOSIS — K219 Gastro-esophageal reflux disease without esophagitis: Secondary | ICD-10-CM

## 2016-08-09 NOTE — Progress Notes (Addendum)
  Medical Nutrition Therapy:  Appt start time: 0800 end time:  0930.  Assessment:  Primary concerns today: Undeweight. Lives by himself. He is here  wanting to gain weight and improve his reflux symptoms. Most he ever weighted  Was 118 lbs  In 2013. Dad: 4773" and was thin his whole life til he was in his 4530's and now he is 170 lbs. Mom 4'11 and weighs 105 lbs. PMH: GERD. Taking Protonix and was on an antibilotic for HPylori in the summer. Has chronic stomach pains and reflux symptoms which limits his appetite and desire for certain foods.   Currently on Protonix.    Typically eats 2 meals and snacks some. Doesn't usually eat breakfast or may eat juice or crackers for breakfast. Diet is highly processed, high in fat, sugar and salt. Low in fresh fruits, vegetables and whole grains. Current diet is not meeting his needs for his overall health and weight gain.   He is motivated and willing to change eating habits to improve his Reflux/GERD and provide weight gain.  Preferred Learning Style:   No preference indicated   Learning Readiness:   Ready  Change in progress  MEDICATIONS:    DIETARY INTAKE:.    24-hr recall:  B ( AM): skips: crackers and juice-orange Snk ( AM): pizza rolls 15  L ( PM): skipped  candy bar, cookies or soda Snk ( PM): water D ( PM):  Tacos -2 at home; Sweet tea Snk ( PM): candy bar, Lemonade Beverages: soda- Mt Dew, lemonade, tea, water   Usual physical activity: ADL  Estimated energy needs: 1800-2000  calories 225 g carbohydrates 150 g protein 56 g fat  Progress Towards Goal(s):  In progress.   Nutritional Diagnosis:  NI-1.4 Inadequate energy intake As related to REFLUX/GERD.  As evidenced by BMI less than 19..    Intervention:  Healthy Diet using My Plate, portion sizes, Reflux/GERD Nutrition therapy, weight gain tips, benefits of healthy good quality snacks. Avoiding processed and fried foods.  Teaching Method Utilized:  Visual Auditory Hands  on  Handouts given during visit include:  GERD Nutrition Therapy  Weight gain tips  My Plate  Barriers to learning/adherence to lifestyle change: None  Demonstrated degree of understanding via:  Teach Back   Monitoring/Evaluation:  Dietary intake, exercise, meal planning, and body weight in 1 month(s).  Would consider re-evaluating for HPylori and consider an EGD to rule out stomach ulcers.

## 2016-08-09 NOTE — Patient Instructions (Addendum)
Goals Follow Plate Method of eating three meals per day and 2-3 healthy snacks. 1. Cut back on sugar-sodas, candy, sweets 2. Cut out snacks at night 3. Drink more water 4. Cut fried foods and processed foods-fast foods, junk foods Eat three meals per day Try yogurt daily  Gain 3 lbs by next month.

## 2016-09-07 ENCOUNTER — Ambulatory Visit: Payer: Self-pay | Admitting: Physician Assistant

## 2016-09-07 ENCOUNTER — Encounter: Payer: Self-pay | Admitting: Physician Assistant

## 2016-09-07 VITALS — BP 112/64 | HR 66 | Temp 97.5°F | Ht 66.25 in | Wt 100.2 lb

## 2016-09-07 DIAGNOSIS — R636 Underweight: Secondary | ICD-10-CM

## 2016-09-07 DIAGNOSIS — K219 Gastro-esophageal reflux disease without esophagitis: Secondary | ICD-10-CM

## 2016-09-07 NOTE — Progress Notes (Signed)
BP 112/64 (BP Location: Left Arm, Patient Position: Sitting, Cuff Size: Small)   Pulse 66   Temp 97.5 F (36.4 C)   Ht 5' 6.25" (1.683 m)   Wt 100 lb 4 oz (45.5 kg)   SpO2 99%   BMI 16.06 kg/m    Subjective:    Patient ID: Zachary Hartman, male    DOB: Mar 04, 1986, 31 y.o.   MRN: [id number]  HPI: Zachary Hartman is a 31 y.o. male presenting on 09/07/2016 for Gastroesophageal Reflux and Weight Check   HPI  Pt states stomach feels so-so.  He is taking his protonix.  Denies dysphagia.  Today pt says that he was crushing his pylera because they were so big.   Still feels a little rumbling in his belly.  Pt says he has been eating.   He says he is eating 3 or 4 times daily.   He says that his GERD is very much improved.  Pt says he doesn't feel like he has any anxiety or depression.     Pt's Phone # [phone]  Relevant past medical, surgical, family and social history reviewed and updated as indicated. Interim medical history since our last visit reviewed. Allergies and medications reviewed and updated.   Current Outpatient Prescriptions:  .  pantoprazole (PROTONIX) 40 MG tablet, Take 1 tablet (40 mg total) by mouth daily., Disp: 30 tablet, Rfl: 3   Review of Systems  Constitutional: Negative for appetite change, chills, diaphoresis, fatigue, fever and unexpected weight change.  HENT: Positive for dental problem and sneezing. Negative for congestion, drooling, ear pain, facial swelling, hearing loss, mouth sores, sore throat, trouble swallowing and voice change.   Eyes: Negative for pain, discharge, redness, itching and visual disturbance.  Respiratory: Negative for cough, choking, shortness of breath and wheezing.   Cardiovascular: Negative for chest pain, palpitations and leg swelling.  Gastrointestinal: Negative for abdominal pain, blood in stool, constipation, diarrhea and vomiting.  Endocrine: Positive for cold intolerance. Negative for heat intolerance and  polydipsia.  Genitourinary: Negative for decreased urine volume, dysuria and hematuria.  Musculoskeletal: Negative for arthralgias, back pain and gait problem.  Skin: Negative for rash.  Allergic/Immunologic: Negative for environmental allergies.  Neurological: Negative for seizures, syncope, light-headedness and headaches.  Hematological: Negative for adenopathy.  Psychiatric/Behavioral: Negative for agitation, dysphoric mood and suicidal ideas. The patient is not nervous/anxious.     Per HPI unless specifically indicated above     Objective:    BP 112/64 (BP Location: Left Arm, Patient Position: Sitting, Cuff Size: Small)   Pulse 66   Temp 97.5 F (36.4 C)   Ht 5' 6.25" (1.683 m)   Wt 100 lb 4 oz (45.5 kg)   SpO2 99%   BMI 16.06 kg/m   Wt Readings from Last 3 Encounters:  09/07/16 100 lb 4 oz (45.5 kg)  08/09/16 102 lb (46.3 kg)  08/03/16 102 lb 12 oz (46.6 kg)     06/22/16 105 pounds   Physical Exam  Constitutional: He is oriented to person, place, and time. Vital signs are normal.  Very thin  HENT:  Head: Normocephalic and atraumatic.  Neck: Neck supple.  Cardiovascular: Normal rate and regular rhythm.   Pulmonary/Chest: Effort normal and breath sounds normal. He has no wheezes.  Abdominal: Soft. Bowel sounds are normal. He exhibits no distension and no mass. There is no hepatosplenomegaly. There is no tenderness. There is no rigidity, no rebound and no guarding.  Musculoskeletal: He exhibits no edema.  Lymphadenopathy:    He has no cervical adenopathy.  Neurological: He is alert and oriented to person, place, and time.  Skin: Skin is warm and dry.  Psychiatric: He has a normal mood and affect. His behavior is normal.  Vitals reviewed.       Assessment & Plan:   Encounter Diagnoses  Name Primary?  . Gastroesophageal reflux disease, esophagitis presence not specified Yes  . Underweight      Discussed with pt that his weight is going down and it needs  to go up.  Discussed possible referral to GI but that his symptoms are all improved by his report.  Will readdress at follow up appointment in one month.  Discussed eating high calorie protein rich diet and gave handout.

## 2016-09-07 NOTE — Patient Instructions (Signed)
High-Protein and High-Calorie Diet Introduction Eating high-protein and high-calorie foods can help you to gain weight, heal after an injury, and recover after an illness or surgery. What is my plan? The specific amount of daily protein and calories you need depends on:  Your body weight.  The reason this diet is recommended for you. Generally, a high-protein, high-calorie diet involves:  Eating 250-500 extra calories each day.  Making sure that        of your daily calories come from protein. Talk to your health care provider about how much protein and how many calories you need each day. Follow the diet as directed by your health care provider. What do I need to know about this diet?  Ask your health care provider if you should take a nutritional supplement.  Try to eat six small meals each day instead of three large meals.  Eat a balanced diet, including one food that is high in protein at each meal.  Keep nutritious snacks handy, such as nuts, trail mixes, dried fruit, and yogurt.  If you have kidney disease or diabetes, eating too much protein may put extra stress on your kidneys. Talk to your health care provider if you have either of those conditions. What are some high-protein foods? Grains  Quinoa. Bulgur wheat. Vegetables  Soybeans. Peas. Meats and Other Protein Sources  Beef, pork, and poultry. Fish and seafood. Eggs. Tofu. Textured vegetable protein (TVP). Peanut butter. Nuts and seeds. Dried beans. Protein powders. Dairy  Whole milk. Whole-milk yogurt. Powdered milk. Cheese. [employer]ott[employer] Cheese. Eggnog. Beverages  High-protein supplement drinks. Soy milk. Other  Protein bars. The items listed above may not be a complete list of recommended foods or beverages. Contact your dietitian for more options.  What are some high-calorie foods? Grains  Pasta. Quick breads. Muffins. Pancakes. Ready-to-eat cereal. Vegetables  Vegetables cooked in oil or butter. Fried  potatoes. Fruits  Dried fruit. Fruit leather. Canned fruit in syrup. Fruit juice. Avocados. Meats and Other Protein Sources  Peanut butter. Nuts and seeds. Dairy  Heavy cream. Whipped cream. Cream cheese. Sour cream. Ice cream. Custard. Pudding. Beverages  Meal-replacement beverages. Nutrition shakes. Fruit juice. Sugar-sweetened soft drinks. Condiments  Salad dressing. Mayonnaise. Alfredo sauce. Fruit preserves or jelly. Honey. Syrup. Sweets/Desserts  Cake. Cookies. Pie. Pastries. Candy bars. Chocolate. Fats and Oils  Butter or margarine. Oil. Gravy. Other  Meal-replacement bars. The items listed above may not be a complete list of recommended foods or beverages. Contact your dietitian for more options.  What are some tips for including high-protein and high-calorie foods in my diet?  Add whole milk, half-and-half, or heavy cream to cereal, pudding, soup, or hot cocoa.  Add whole milk to instant breakfast drinks.  Add peanut butter to oatmeal or smoothies.  Add powdered milk to baked goods, smoothies, or milkshakes.  Add powdered milk, cream, or butter to mashed potatoes.  Add cheese to cooked vegetables.  Make whole-milk yogurt parfaits. Top them with granola, fruit, or nuts.  Add cottage cheese to your fruit.  Add avocados, cheese, or both to sandwiches or salads.  Add meat, poultry, or seafood to rice, pasta, casseroles, salads, and soups.  Use mayonnaise when making egg salad, chicken salad, or tuna salad.  Use peanut butter as a topping for pretzels, celery, or crackers.  Add beans to casseroles, dips, and spreads.  Add pureed beans to sauces and soups.  Replace calorie-free drinks with calorie-containing drinks, such as milk and fruit juice. This information is not  intended to replace advice given to you by your health care provider. Make sure you discuss any questions you have with your health care provider. Document Released: 08/15/2005 Document Revised:  01/21/2016 Document Reviewed: 01/28/2014  2017 Elsevier

## 2016-09-13 ENCOUNTER — Telehealth: Payer: Self-pay | Admitting: Nutrition

## 2016-09-13 NOTE — Telephone Encounter (Signed)
Pt's phone not working. Left message on pt's mothers phone notifying them his appt has been canceled due to weather and to call back to reschedule appt.

## 2016-09-14 ENCOUNTER — Ambulatory Visit: Payer: Self-pay | Admitting: Nutrition

## 2016-10-10 ENCOUNTER — Encounter: Payer: Self-pay | Admitting: Physician Assistant

## 2016-10-10 ENCOUNTER — Ambulatory Visit: Payer: Self-pay | Admitting: Physician Assistant

## 2016-10-10 VITALS — BP 112/70 | HR 82 | Temp 97.7°F | Ht 66.0 in | Wt 103.5 lb

## 2016-10-10 DIAGNOSIS — R636 Underweight: Secondary | ICD-10-CM

## 2016-10-10 DIAGNOSIS — K219 Gastro-esophageal reflux disease without esophagitis: Secondary | ICD-10-CM

## 2016-10-10 MED ORDER — PANTOPRAZOLE SODIUM 40 MG PO TBEC: 40.0000 mg | DELAYED_RELEASE_TABLET | Freq: Every day | ORAL | 3 refills | Status: DC

## 2016-10-10 NOTE — Progress Notes (Signed)
BP 112/70 (BP Location: Left Arm, Patient Position: Sitting, Cuff Size: Small)   Pulse 82   Temp 97.7 F (36.5 C) (Other (Comment))   Ht 5\' 6"  (1.676 m)   Wt 103 lb 8 oz (46.9 kg)   SpO2 99%   BMI 16.71 kg/m    Subjective:    Patient ID: Zachary Hartman, male    DOB: 04/04/1986, 31 y.o.   MRN: [id number]  HPI: Zachary Hartman is a 31 y.o. male presenting on 10/10/2016 for Follow-up   HPI   Pt ran out of his protonix 2 wk ago and didn't get it refilled.  He has abddominal pain some days and not others.  Discussed omeprazole from [facility] for free and he prefers to continue with protonix  Pt missed his last appointment with the dietician due to snow.  He has not yet rescheduled the appointment.  Again asked pt about depression, mood problems.  He denies and says he feels fine.   Relevant past medical, surgical, family and social history reviewed and updated as indicated. Interim medical history since our last visit reviewed. Allergies and medications reviewed and updated.  CURRENT MEDS: none  Review of Systems  Constitutional: Positive for unexpected weight change. Negative for appetite change, chills, diaphoresis, fatigue and fever.  HENT: Positive for hearing loss. Negative for congestion, dental problem, drooling, ear pain, facial swelling, mouth sores, sneezing, sore throat, trouble swallowing and voice change.   Eyes: Negative for pain, discharge, redness, itching and visual disturbance.  Respiratory: Negative for cough, choking, shortness of breath and wheezing.   Cardiovascular: Negative for chest pain, palpitations and leg swelling.  Gastrointestinal: Negative for abdominal pain, blood in stool, constipation, diarrhea and vomiting.  Endocrine: Negative for cold intolerance, heat intolerance and polydipsia.  Genitourinary: Negative for decreased urine volume, dysuria and hematuria.  Musculoskeletal: Negative for arthralgias, back pain and gait problem.  Skin:  Negative for rash.  Allergic/Immunologic: Negative for environmental allergies.  Neurological: Negative for seizures, syncope, light-headedness and headaches.  Hematological: Negative for adenopathy.  Psychiatric/Behavioral: Negative for agitation, dysphoric mood and suicidal ideas. The patient is not nervous/anxious.     Per HPI unless specifically indicated above     Objective:    BP 112/70 (BP Location: Left Arm, Patient Position: Sitting, Cuff Size: Small)   Pulse 82   Temp 97.7 F (36.5 C) (Other (Comment))   Ht 5\' 6"  (1.676 m)   Wt 103 lb 8 oz (46.9 kg)   SpO2 99%   BMI 16.71 kg/m   Wt Readings from Last 3 Encounters:  10/10/16 103 lb 8 oz (46.9 kg)  09/07/16 100 lb 4 oz (45.5 kg)  08/09/16 102 lb (46.3 kg)    Physical Exam  Constitutional: He is oriented to person, place, and time. Vital signs are normal.  Very thin  HENT:  Head: Normocephalic and atraumatic.  Neck: Neck supple.  Cardiovascular: Normal rate and regular rhythm.   Pulmonary/Chest: Effort normal and breath sounds normal. He has no wheezes.  Abdominal: Soft. Bowel sounds are normal. There is no hepatosplenomegaly. There is no tenderness.  Musculoskeletal: He exhibits no edema.  Lymphadenopathy:    He has no cervical adenopathy.  Neurological: He is alert and oriented to person, place, and time.  Skin: Skin is warm and dry.  Psychiatric: He has a normal mood and affect. His behavior is normal.  Vitals reviewed.       Assessment & Plan:    Encounter Diagnoses  Name Primary?  Marland Kitchen  Underweight Yes  . Gastroesophageal reflux disease, esophagitis presence not specified     -Pt to call to reschedule nutritionist appointment -pt to Get back on protonix -Will disuss at next OV GI refer or not (while on ppi) F/u 6 weeks.  RTO sooner for any problems

## 2016-10-10 NOTE — Patient Instructions (Addendum)
Call to reschedule appointment:  [facility] [place] [phone]  [address]  [place], [place] [id number]

## 2016-10-14 ENCOUNTER — Emergency Department (HOSPITAL_COMMUNITY): Payer: Self-pay

## 2016-10-14 ENCOUNTER — Encounter (HOSPITAL_COMMUNITY): Payer: Self-pay | Admitting: *Deleted

## 2016-10-14 ENCOUNTER — Emergency Department (HOSPITAL_COMMUNITY)
Admission: EM | Admit: 2016-10-14 | Discharge: 2016-10-14 | Disposition: A | Discharge status: Home / Self Care | Payer: Self-pay | Attending: Emergency Medicine | Admitting: Emergency Medicine

## 2016-10-14 DIAGNOSIS — R110 Nausea: Secondary | ICD-10-CM | POA: Insufficient documentation

## 2016-10-14 LAB — CBC WITH DIFFERENTIAL/PLATELET
Basophils Absolute: 0.1 10*3/uL (ref 0.0–0.1)
Basophils Relative: 1 %
Eosinophils Absolute: 0.2 10*3/uL (ref 0.0–0.7)
Eosinophils Relative: 2 %
HEMATOCRIT: 44.1 % (ref 39.0–52.0)
HEMOGLOBIN: 15.9 g/dL (ref 13.0–17.0)
LYMPHS ABS: 1.4 10*3/uL (ref 0.7–4.0)
LYMPHS PCT: 11 %
MCH: 29.4 pg (ref 26.0–34.0)
MCHC: 36.1 g/dL — AB (ref 30.0–36.0)
MCV: 81.5 fL (ref 78.0–100.0)
MONOS PCT: 4 %
Monocytes Absolute: 0.6 10*3/uL (ref 0.1–1.0)
NEUTROS ABS: 11.2 10*3/uL — AB (ref 1.7–7.7)
NEUTROS PCT: 82 %
Platelets: 263 10*3/uL (ref 150–400)
RBC: 5.41 MIL/uL (ref 4.22–5.81)
RDW: 12.2 % (ref 11.5–15.5)
WBC: 13.5 10*3/uL — ABNORMAL HIGH (ref 4.0–10.5)

## 2016-10-14 LAB — COMPREHENSIVE METABOLIC PANEL
ALK PHOS: 71 U/L (ref 38–126)
ALT: 15 U/L — AB (ref 17–63)
ANION GAP: 10 (ref 5–15)
AST: 26 U/L (ref 15–41)
Albumin: 4.8 g/dL (ref 3.5–5.0)
BILIRUBIN TOTAL: 1 mg/dL (ref 0.3–1.2)
BUN: 10 mg/dL (ref 6–20)
CALCIUM: 9.8 mg/dL (ref 8.9–10.3)
CO2: 26 mmol/L (ref 22–32)
CREATININE: 0.9 mg/dL (ref 0.61–1.24)
Chloride: 100 mmol/L — ABNORMAL LOW (ref 101–111)
GFR calc non Af Amer: >60 mL/min (ref >60)
GLUCOSE: 102 mg/dL — AB (ref 65–99)
Potassium: 3.3 mmol/L — ABNORMAL LOW (ref 3.5–5.1)
Sodium: 136 mmol/L (ref 135–145)
TOTAL PROTEIN: 8 g/dL (ref 6.5–8.1)

## 2016-10-14 LAB — LIPASE, BLOOD: Lipase: 72 U/L — ABNORMAL HIGH (ref 11–51)

## 2016-10-14 MED ORDER — PANTOPRAZOLE SODIUM 40 MG IV SOLR
40.0000 mg | Freq: Once | INTRAVENOUS | Status: AC
  Administered 2016-10-14: 40 mg via INTRAVENOUS
  Filled 2016-10-14: qty 40

## 2016-10-14 MED ORDER — SODIUM CHLORIDE 0.9 % IV BOLUS (SEPSIS)
1000.0000 mL | Freq: Once | INTRAVENOUS | Status: AC
  Administered 2016-10-14: 1000 mL via INTRAVENOUS

## 2016-10-14 MED ORDER — ONDANSETRON 4 MG PO TBDP: ORAL_TABLET | ORAL | 0 refills | Status: DC

## 2016-10-14 MED ORDER — LORAZEPAM 2 MG/ML IJ SOLN
0.5000 mg | Freq: Once | INTRAMUSCULAR | Status: AC
  Administered 2016-10-14: 0.5 mg via INTRAVENOUS
  Filled 2016-10-14: qty 1

## 2016-10-14 MED ORDER — TRAMADOL HCL 50 MG PO TABS: 50.0000 mg | ORAL_TABLET | Freq: Four times a day (QID) | ORAL | 0 refills | Status: DC | PRN

## 2016-10-14 MED ORDER — ONDANSETRON HCL 4 MG/2ML IJ SOLN
4.0000 mg | Freq: Once | INTRAMUSCULAR | Status: AC
  Administered 2016-10-14: 4 mg via INTRAVENOUS
  Filled 2016-10-14: qty 2

## 2016-10-14 NOTE — Discharge Instructions (Signed)
Take liquids only for the first day. Follow-up with your family doctor next week. Return if problems

## 2016-10-14 NOTE — ED Triage Notes (Signed)
Pt arrived by EMS stating he woke this morning around 0400 nauseated, denies vomiting or diarrhea.

## 2016-10-14 NOTE — ED Provider Notes (Signed)
[facility]PT Provider Note   CSN: [id number] Arrival date & time: 10/14/16  0553     History   Chief Complaint Chief Complaint  Patient presents with  . Nausea    HPI Zachary Hartman is a 31 y.o. male.  Patient complains of nausea. No pain   The history is provided by the patient. No language interpreter was used.  Illness  This is a new problem. The current episode started 12 to 24 hours ago. The problem occurs constantly. The problem has not changed since onset.Pertinent negatives include no chest pain, no abdominal pain and no headaches. Nothing aggravates the symptoms. Nothing relieves the symptoms. He has tried nothing for the symptoms.    Past Medical History:  Diagnosis Date  . Acute pancreatitis 2013  . GERD (gastroesophageal reflux disease)   . Migraines     Patient Active Problem List   Diagnosis Date Noted  . Anxiety 03/10/2016  . Underweight 03/10/2016  . H. pylori infection 03/10/2016  . Esophageal reflux 02/19/2016  . Abdominal pain, epigastric 02/19/2016    History reviewed. No pertinent surgical history.     Home Medications    Prior to Admission medications   Medication Sig Start Date End Date Taking? Authorizing Provider  pantoprazole (PROTONIX) 40 MG tablet Take 1 tablet (40 mg total) by mouth daily. 10/10/16  Yes Jacquelin Hawking, PA-C  ondansetron (ZOFRAN ODT) 4 MG disintegrating tablet 4mg  ODT q4 hours prn nausea/vomit 10/14/16   Bethann Berkshire, MD  traMADol (ULTRAM) 50 MG tablet Take 1 tablet (50 mg total) by mouth every 6 (six) hours as needed. 10/14/16   Bethann Berkshire, MD    Family History Family History  Problem Relation Age of Onset  . Fibroids Mother   . Cancer Sister     Thyroid Cancer  . Diabetes Paternal Grandfather     Social History Social History  Substance Use Topics  . Smoking status: Never Smoker  . Smokeless tobacco: Never Used  . Alcohol use No     Allergies   Ibuprofen; Morphine and related;  Tylenol [acetaminophen]; and Penicillins   Review of Systems Review of Systems  Constitutional: Negative for appetite change and fatigue.  HENT: Negative for congestion, ear discharge and sinus pressure.   Eyes: Negative for discharge.  Respiratory: Negative for cough.   Cardiovascular: Negative for chest pain.  Gastrointestinal: Positive for nausea. Negative for abdominal pain and diarrhea.  Genitourinary: Negative for frequency and hematuria.  Musculoskeletal: Negative for back pain.  Skin: Negative for rash.  Neurological: Negative for seizures and headaches.  Psychiatric/Behavioral: Negative for hallucinations.     Physical Exam Updated Vital Signs BP 114/64   Pulse 116   Temp 97.7 F (36.5 C) (Oral)   Resp 16   Ht 5\' 6"  (1.676 m)   Wt 110 lb (49.9 kg)   SpO2 97%   BMI 17.75 kg/m   Physical Exam  Constitutional: He is oriented to person, place, and time. He appears well-developed.  HENT:  Head: Normocephalic.  Eyes: Conjunctivae and EOM are normal. No scleral icterus.  Neck: Neck supple. No thyromegaly present.  Cardiovascular: Normal rate and regular rhythm.  Exam reveals no gallop and no friction rub.   No murmur heard. Pulmonary/Chest: No stridor. He has no wheezes. He has no rales. He exhibits no tenderness.  Abdominal: He exhibits no distension. There is no tenderness. There is no rebound.  Musculoskeletal: Normal range of motion. He exhibits no edema.  Lymphadenopathy:    He  has no cervical adenopathy.  Neurological: He is oriented to person, place, and time. He exhibits normal muscle tone. Coordination normal.  Skin: No rash noted. No erythema.  Psychiatric: He has a normal mood and affect. His behavior is normal.     ED Treatments / Results  Labs (all labs ordered are listed, but only abnormal results are displayed) Labs Reviewed  CBC WITH DIFFERENTIAL/PLATELET - Abnormal; Notable for the following:       Result Value   WBC 13.5 (*)    MCHC 36.1  (*)    Neutro Abs 11.2 (*)    All other components within normal limits  COMPREHENSIVE METABOLIC PANEL - Abnormal; Notable for the following:    Potassium 3.3 (*)    Chloride 100 (*)    Glucose, Bld 102 (*)    ALT 15 (*)    All other components within normal limits  LIPASE, BLOOD - Abnormal; Notable for the following:    Lipase 72 (*)    All other components within normal limits    EKG  EKG Interpretation None       Radiology Dg Abd Acute W/chest  Result Date: 10/14/2016 CLINICAL DATA:  Nausea EXAM: DG ABDOMEN ACUTE W/ 1V CHEST COMPARISON:  10/20/2015 FINDINGS: Cardiac shadow is within normal limits. The lungs are clear bilaterally. Scattered large and small bowel gas is noted. No obstructive changes are seen. Calcifications are noted over the outline of the right kidney which may represent nonobstructing stones although may be related to cartilaginous calcification. No other focal abnormality is noted. IMPRESSION: Calcifications over the right renal outline which may be related to nonobstructing stones or cartilage calcifications. Electronically Signed   By: Alcide CleverMark  Lukens M.D.   On: 10/14/2016 09:53    Procedures Procedures (including critical care time)  Medications Ordered in ED Medications  ondansetron (ZOFRAN) injection 4 mg (4 mg Intravenous Given 10/14/16 0618)  sodium chloride 0.9 % bolus 1,000 mL (0 mLs Intravenous Stopped 10/14/16 0822)  ondansetron (ZOFRAN) injection 4 mg (4 mg Intravenous Given 10/14/16 0744)  pantoprazole (PROTONIX) injection 40 mg (40 mg Intravenous Given 10/14/16 0745)  LORazepam (ATIVAN) injection 0.5 mg (0.5 mg Intravenous Given 10/14/16 0743)  sodium chloride 0.9 % bolus 1,000 mL (1,000 mLs Intravenous New Bag/Given 10/14/16 0930)     Initial Impression / Assessment and Plan / ED Course  I have reviewed the triage vital signs and the nursing notes.  Pertinent labs & imaging results that were available during my care of the patient were reviewed  by me and considered in my medical decision making (see chart for details).     Patient with nausea and elevated lipase. Probably pancreatitis. He is sent home with Zofran and Ultram. Patient improved with IV fluids and nausea medicine in the emergency department. He'll follow-up with his PCP next week  Final Clinical Impressions(s) / ED Diagnoses   Final diagnoses:  Nausea    New Prescriptions New Prescriptions   ONDANSETRON (ZOFRAN ODT) 4 MG DISINTEGRATING TABLET    4mg  ODT q4 hours prn nausea/vomit   TRAMADOL (ULTRAM) 50 MG TABLET    Take 1 tablet (50 mg total) by mouth every 6 (six) hours as needed.     Bethann BerkshireJoseph Yoana Staib, MD 10/14/16 1034

## 2016-10-26 ENCOUNTER — Ambulatory Visit: Payer: Self-pay | Admitting: Nutrition

## 2016-11-21 ENCOUNTER — Ambulatory Visit: Payer: Self-pay | Admitting: Physician Assistant

## 2016-11-21 ENCOUNTER — Encounter: Payer: Self-pay | Admitting: Physician Assistant

## 2016-11-21 VITALS — BP 110/70 | HR 69 | Temp 97.7°F | Ht 66.0 in | Wt 100.5 lb

## 2016-11-21 DIAGNOSIS — Z8719 Personal history of other diseases of the digestive system: Secondary | ICD-10-CM

## 2016-11-21 DIAGNOSIS — R1013 Epigastric pain: Secondary | ICD-10-CM

## 2016-11-21 DIAGNOSIS — R634 Abnormal weight loss: Secondary | ICD-10-CM

## 2016-11-21 DIAGNOSIS — E876 Hypokalemia: Secondary | ICD-10-CM

## 2016-11-21 DIAGNOSIS — K219 Gastro-esophageal reflux disease without esophagitis: Secondary | ICD-10-CM

## 2016-11-21 DIAGNOSIS — R110 Nausea: Secondary | ICD-10-CM

## 2016-11-21 DIAGNOSIS — Z8619 Personal history of other infectious and parasitic diseases: Secondary | ICD-10-CM

## 2016-11-21 DIAGNOSIS — R636 Underweight: Secondary | ICD-10-CM

## 2016-11-21 LAB — BASIC METABOLIC PANEL
BUN: 10 mg/dL (ref 7–25)
CHLORIDE: 102 mmol/L (ref 98–110)
CO2: 27 mmol/L (ref 20–31)
Calcium: 9.1 mg/dL (ref 8.6–10.3)
Creat: 0.92 mg/dL (ref 0.60–1.35)
Glucose, Bld: 94 mg/dL (ref 65–99)
POTASSIUM: 3.6 mmol/L (ref 3.5–5.3)
SODIUM: 140 mmol/L (ref 135–146)

## 2016-11-21 NOTE — Progress Notes (Signed)
BP 110/70 (BP Location: Left Arm, Patient Position: Sitting, Cuff Size: Small)   Pulse 69   Temp 97.7 F (36.5 C)   Ht 5\' 6"  (1.676 m)   Wt 100 lb 8 oz (45.6 kg)   SpO2 99%   BMI 16.22 kg/m    Subjective:    Patient ID: Zachary Hartman, male    DOB: Feb 22, 1986, 31 y.o.   MRN: [id number]  HPI: Zachary Beardsathaniel J Rittenberry is a 31 y.o. male presenting on 11/21/2016 for Follow-up   HPI  Pt went to ER in February and was diagnosed with pancreatitis.  He says he is eating again.  His weight is still down.    Pt says he still has nausea at times.   Pt denies etoh.  Pt missed his last appointment with nutritionist due to snow.  He did not get appointment rescheduled  Pt states [facility].  He says it was negative.  He says he only has intercourse with his girlfriend but says he gets HIV tested every 6 months.   Relevant past medical, surgical, family and social history reviewed and updated as indicated. Interim medical history since our last visit reviewed. Allergies and medications reviewed and updated.   Current Outpatient Prescriptions:  .  pantoprazole (PROTONIX) 40 MG tablet, Take 1 tablet (40 mg total) by mouth daily., Disp: 30 tablet, Rfl: 3   Review of Systems  Constitutional: Negative for appetite change, chills, diaphoresis, fatigue, fever and unexpected weight change.  HENT: Positive for dental problem. Negative for congestion, drooling, ear pain, facial swelling, hearing loss, mouth sores, sneezing, sore throat, trouble swallowing and voice change.   Eyes: Negative for pain, discharge, redness, itching and visual disturbance.  Respiratory: Negative for cough, choking, shortness of breath and wheezing.   Cardiovascular: Negative for chest pain, palpitations and leg swelling.  Gastrointestinal: Positive for abdominal pain. Negative for blood in stool, constipation, diarrhea and vomiting.  Endocrine: Negative for cold  intolerance, heat intolerance and polydipsia.  Genitourinary: Negative for decreased urine volume, dysuria and hematuria.  Musculoskeletal: Negative for arthralgias, back pain and gait problem.  Skin: Negative for rash.  Allergic/Immunologic: Negative for environmental allergies.  Neurological: Positive for headaches. Negative for seizures, syncope and light-headedness.  Hematological: Negative for adenopathy.  Psychiatric/Behavioral: Positive for agitation. Negative for dysphoric mood and suicidal ideas. The patient is not nervous/anxious.     Per HPI unless specifically indicated above     Objective:    BP 110/70 (BP Location: Left Arm, Patient Position: Sitting, Cuff Size: Small)   Pulse 69   Temp 97.7 F (36.5 C)   Ht 5\' 6"  (1.676 m)   Wt 100 lb 8 oz (45.6 kg)   SpO2 99%   BMI 16.22 kg/m   Wt Readings from Last 3 Encounters:  11/21/16 100 lb 8 oz (45.6 kg)  10/14/16 110 lb (49.9 kg)  10/10/16 103 lb 8 oz (46.9 kg)    Physical Exam  Constitutional: He is oriented to person, place, and time. He appears well-developed and well-nourished.  HENT:  Head: Normocephalic and atraumatic.  Neck: Neck supple.  Cardiovascular: Normal rate and regular rhythm.   Pulmonary/Chest: Effort normal and breath sounds normal. He has no wheezes.  Abdominal: Soft. Bowel sounds are normal. There is no hepatosplenomegaly. There is no tenderness.  Musculoskeletal: He exhibits no edema.  Lymphadenopathy:    He has no cervical adenopathy.  Neurological: He is alert and oriented to person, place,  and time.  Skin: Skin is warm and dry.  Psychiatric: He has a normal mood and affect. His behavior is normal.  Vitals reviewed.       Assessment & Plan:   Encounter Diagnoses  Name Primary?  Marland Kitchen Underweight Yes  . Gastroesophageal reflux disease, esophagitis presence not specified   . Hypokalemia   . Abdominal pain, epigastric   . History of Helicobacter pylori infection   . History of acute  pancreatitis   . Nausea   . Weight loss      -sent record Request to [facility] department for HIV test result -Recheck k+ -Refer to GI for nausea, pancreatitis, wt loss, recent h pylori treatment -Gave pt [employer] application - pt to call for appt with nutritionist.  He is given the phone number -pt encouraged again to use Ensure or similar for nutritional improvement -will Start megace- pt filled out pt assitance form -f/u 1 month.  RTO sooner prn

## 2016-11-21 NOTE — Patient Instructions (Signed)
Call nutritionist to reschedule the appointment you missed due to snow- [phone]  Get labs/bloodwork drawn  Turn in [employer] discount application  Ensure or similar for nutritional supplementation

## 2016-11-23 ENCOUNTER — Encounter: Payer: Self-pay | Admitting: Internal Medicine

## 2016-12-14 ENCOUNTER — Encounter: Payer: Self-pay | Admitting: Nurse Practitioner

## 2016-12-14 ENCOUNTER — Ambulatory Visit (INDEPENDENT_AMBULATORY_CARE_PROVIDER_SITE_OTHER): Payer: Self-pay | Admitting: Nurse Practitioner

## 2016-12-14 VITALS — BP 118/76 | HR 63 | Temp 98.0°F | Ht 68.0 in | Wt 102.0 lb

## 2016-12-14 DIAGNOSIS — R1013 Epigastric pain: Secondary | ICD-10-CM

## 2016-12-14 DIAGNOSIS — A048 Other specified bacterial intestinal infections: Secondary | ICD-10-CM

## 2016-12-14 DIAGNOSIS — R636 Underweight: Secondary | ICD-10-CM

## 2016-12-14 DIAGNOSIS — K219 Gastro-esophageal reflux disease without esophagitis: Secondary | ICD-10-CM

## 2016-12-14 NOTE — Assessment & Plan Note (Signed)
The patient has epigastric pain which is significantly improved. Last episode was about one to 2 weeks ago, February 2018 prior to that, October 2017 prior to that. His pain seems to be more rare than regular after H. pylori treatment. Testing for eradication as per above. Return for follow-up in 3 months.

## 2016-12-14 NOTE — Progress Notes (Signed)
cc'ed to pcp °

## 2016-12-14 NOTE — Assessment & Plan Note (Signed)
The patient is a thin, small framed male. He states he has not had an overt trend in weight but typically fluctuates between 101 10. Both of his parents were small framed as well. Continue to monitor her weight and note any overt trend downward. Return for follow-up in 3 months.

## 2016-12-14 NOTE — Assessment & Plan Note (Signed)
GERD currently well-controlled on PPI. Recommend continue PPI except for as per below. Return for follow-up in 3 months. Call us if any worsening symptoms before then.

## 2016-12-14 NOTE — Assessment & Plan Note (Signed)
History of H pylori infection status post treatment with Pylera. Recommendations now include testing for eradication and all patients. He does have intermittent abdominal pain, although it tends to be more rare been regular. At this point I'll have him stop PPI for 2 weeks and completed a urea breath test. After his breath test she can continue his Protonix. Return for follow-up in 3 months. Call if any worsening or recurrent symptoms.

## 2016-12-14 NOTE — Patient Instructions (Signed)
1. Stop taking Protonix for 2 weeks. 2. Have your breath test done for H. pylori after you have been off Protonix for 2 weeks. 3. After your breath test she can restart your Protonix. 4. Return for follow-up in 3 months. 5. Call us if you have any worsening symptoms before then.

## 2016-12-14 NOTE — Progress Notes (Signed)
Primary Care Physician:  Jacquelin Hawking, PA-C Primary Gastroenterologist:  Dr.Rourk  Chief Complaint  Patient presents with  . Weight Loss  . Gastroesophageal Reflux    HPI:   Zachary Hartman is a 31 y.o. male who presents on referral from primary care for GERD, abdominal pain, H. pylori, history of acute pancreatitis, nausea, vomiting. PCP notes reviewed. Patient last seen by primary care 11/21/2016. Recently diagnosed with pancreatitis in the emergency room in February. His weight is still down but he is eating again, occasional nausea. Denies alcohol use. He missed an appointment with nutritionist due to snow, they worked attempting to get appointment rescheduled at his last office visit. Noted positive H. pylori breath test 02/19/2016 status post treatment with Pylera. It took about 4 months for him to get treatment due to needing to bring in paperwork for samples.  Today he states he's doing ok overall. He has some continued abdominal pain which he states is a lot better. A couple weeks ago he had more significant symptoms. He is on Protonix currently once a day. He did not retest H. Pylori for eradication. Abdominal pain is mid to upper abdomen, described as sharp and crampy or achy. Pain is intermittent will last up to a couple hours. Hasn't really had it lately, last occurrence was 1-2 weeks ago and then February and then last October. Brief mild nausea occasionally and much improved. Denies hematochezia, melena. He states he's always been underweight, "haven't been more then 100 lbs ever." His weight tends to fluctuate from 100 to 110 but no persistent downward trend. States his appetite is great and eats a lot. His parents were both small framed. Bowel movements 1-2 times a day, consistent with [place] 4. Denies chest pain, dyspnea, dizziness, lightheadedness, syncope, near syncope. Denies any other upper or lower GI symptoms.  Past Medical History:  Diagnosis Date  . Acute  pancreatitis 2013  . GERD (gastroesophageal reflux disease)   . Migraines     Past Surgical History:  Procedure Laterality Date  . WISDOM TOOTH EXTRACTION     March 2018 - 3 teeth extracted    Current Outpatient Prescriptions  Medication Sig Dispense Refill  . pantoprazole (PROTONIX) 40 MG tablet Take 1 tablet (40 mg total) by mouth daily. 30 tablet 3   No current facility-administered medications for this visit.     Allergies as of 12/14/2016 - Review Complete 12/14/2016  Allergen Reaction Noted  . Ibuprofen Other (See Comments) 02/18/2016  . Morphine and related Other (See Comments) 03/10/2016  . Tylenol [acetaminophen] Other (See Comments) 02/08/2013  . Penicillins Hives 02/08/2013    Family History  Problem Relation Age of Onset  . Fibroids Mother   . Cancer Sister     Thyroid Cancer  . Diabetes Paternal Grandfather   . Colon cancer Neg Hx   . Gastric cancer Neg Hx   . Esophageal cancer Neg Hx     Social History   Social History  . Marital status: Single    Spouse name: N/A  . Number of children: N/A  . Years of education: N/A   Occupational History  . Not on file.   Social History Main Topics  . Smoking status: Never Smoker  . Smokeless tobacco: Never Used  . Alcohol use No  . Drug use: No  . Sexual activity: Yes    Birth control/ protection: None   Other Topics Concern  . Not on file   Social History Narrative  . No  narrative on file    Review of Systems: General: Negative for anorexia, weight loss, fever, chills, fatigue, weakness. ENT: Negative for hoarseness, difficulty swallowing. CV: Negative for chest pain, angina, palpitations, peripheral edema.  Respiratory: Negative for dyspnea at rest, cough, sputum, wheezing.  GI: See history of present illness. MS: Negative for joint pain, low back pain.  Derm: Negative for rash or itching.  Endo: Negative for unusual weight change.  Heme: Negative for bruising or bleeding. Allergy: Negative  for rash or hives.    Physical Exam: BP 118/76   Pulse 63   Temp 98 F (36.7 C) (Oral)   Ht  (1.727 m)   Wt 102 lb (46.3 kg)   BMI 15.51 kg/m  General:   Thin male. Alert and oriented. Pleasant and cooperative. Well-nourished and well-developed.  Head:  Normocephalic and atraumatic. Eyes:  Without icterus, sclera clear and conjunctiva pink.  Ears:  Normal auditory acuity. Cardiovascular:  S1, S2 present without murmurs appreciated. Extremities without clubbing or edema. Respiratory:  Clear to auscultation bilaterally. No wheezes, rales, or rhonchi. No distress.  Gastrointestinal:  +BS, soft, non-tender and non-distended. No HSM noted. No guarding or rebound. No masses appreciated.  Rectal:  Deferred  Musculoskalatal:  Symmetrical without gross deformities.  Neurologic:  Alert and oriented x4;  grossly normal neurologically. Psych:  Alert and cooperative. Normal mood and affect. Heme/Lymph/Immune: No excessive bruising noted.    12/14/2016 9:06 AM   Disclaimer: This note was dictated with voice recognition software. Similar sounding words can inadvertently be transcribed and may not be corrected upon review.

## 2016-12-22 ENCOUNTER — Ambulatory Visit: Payer: Self-pay | Admitting: Physician Assistant

## 2016-12-27 ENCOUNTER — Ambulatory Visit: Payer: Self-pay | Admitting: Physician Assistant

## 2016-12-29 ENCOUNTER — Encounter: Payer: Self-pay | Admitting: Physician Assistant

## 2016-12-29 ENCOUNTER — Ambulatory Visit: Payer: Self-pay | Admitting: Physician Assistant

## 2016-12-29 VITALS — BP 104/66 | HR 88 | Temp 97.9°F | Ht 68.0 in | Wt 101.0 lb

## 2016-12-29 DIAGNOSIS — K219 Gastro-esophageal reflux disease without esophagitis: Secondary | ICD-10-CM

## 2016-12-29 DIAGNOSIS — R636 Underweight: Secondary | ICD-10-CM

## 2016-12-29 NOTE — Progress Notes (Signed)
BP 104/66 (BP Location: Left Arm, Patient Position: Sitting, Cuff Size: Normal)   Pulse 88   Temp 97.9 F (36.6 C) (Other (Comment))   Ht 5\' 8"  (1.727 m)   Wt 101 lb (45.8 kg)   SpO2 98%   BMI 15.36 kg/m    Subjective:    Patient ID: Marguerita BeardsNathaniel J Zaccaro, male    DOB: 1986/06/22, 31 y.o.   MRN: [id number]  HPI: Marguerita Beardsathaniel J Aragones is a 31 y.o. male presenting on 12/29/2016 for Follow-up   HPI   Pt has been seen by GI and has follow up scheduled there for July.  Pt says he is doing fine and is planning to get h pylori test as ordered by GI.   Relevant past medical, surgical, family and social history reviewed and updated as indicated. Interim medical history since our last visit reviewed. Allergies and medications reviewed and updated.   Current Outpatient Prescriptions:  .  amoxicillin (AMOXIL) 500 MG capsule, Take 500 mg by mouth daily., Disp: , Rfl:  .  pantoprazole (PROTONIX) 40 MG tablet, Take 1 tablet (40 mg total) by mouth daily. (Patient not taking: Reported on 12/29/2016), Disp: 30 tablet, Rfl: 3   Review of Systems  Constitutional: Negative for appetite change, chills, diaphoresis, fatigue, fever and unexpected weight change.  HENT: Negative for congestion, dental problem, drooling, ear pain, facial swelling, hearing loss, mouth sores, sneezing, sore throat, trouble swallowing and voice change.   Eyes: Negative for pain, discharge, redness, itching and visual disturbance.  Respiratory: Negative for cough, choking, shortness of breath and wheezing.   Cardiovascular: Negative for chest pain, palpitations and leg swelling.  Gastrointestinal: Positive for abdominal pain. Negative for blood in stool, constipation, diarrhea and vomiting.  Endocrine: Negative for cold intolerance, heat intolerance and polydipsia.  Genitourinary: Negative for decreased urine volume, dysuria and hematuria.  Musculoskeletal: Negative for arthralgias, back pain and gait problem.  Skin: Negative  for rash.  Allergic/Immunologic: Negative for environmental allergies.  Neurological: Positive for headaches. Negative for seizures, syncope and light-headedness.  Hematological: Negative for adenopathy.  Psychiatric/Behavioral: Negative for agitation, dysphoric mood and suicidal ideas. The patient is not nervous/anxious.     Per HPI unless specifically indicated above     Objective:    BP 104/66 (BP Location: Left Arm, Patient Position: Sitting, Cuff Size: Normal)   Pulse 88   Temp 97.9 F (36.6 C) (Other (Comment))   Ht 5\' 8"  (1.727 m)   Wt 101 lb (45.8 kg)   SpO2 98%   BMI 15.36 kg/m   Wt Readings from Last 3 Encounters:  12/29/16 101 lb (45.8 kg)  12/14/16 102 lb (46.3 kg)  11/21/16 100 lb 8 oz (45.6 kg)    Physical Exam  Constitutional: He is oriented to person, place, and time. He appears well-developed and well-nourished.  HENT:  Head: Normocephalic and atraumatic.  Neck: Neck supple.  Cardiovascular: Normal rate and regular rhythm.   Pulmonary/Chest: Effort normal and breath sounds normal. He has no wheezes.  Abdominal: Soft. Bowel sounds are normal. There is no hepatosplenomegaly. There is no tenderness.  Musculoskeletal: He exhibits no edema.  Lymphadenopathy:    He has no cervical adenopathy.  Neurological: He is alert and oriented to person, place, and time.  Skin: Skin is warm and dry.  Psychiatric: He has a normal mood and affect. His behavior is normal.  Vitals reviewed.       Assessment & Plan:    Encounter Diagnoses  Name Primary?  .Marland Kitchen  Gastroesophageal reflux disease, esophagitis presence not specified Yes  . Underweight     Pt to continue with GI for stomach issues Pt to follow up here 1 year.  RTO sooner prn

## 2017-03-15 ENCOUNTER — Encounter: Payer: Self-pay | Admitting: Nurse Practitioner

## 2017-03-15 ENCOUNTER — Ambulatory Visit: Payer: Self-pay | Admitting: Nurse Practitioner

## 2017-03-15 ENCOUNTER — Telehealth: Payer: Self-pay | Admitting: Nurse Practitioner

## 2017-03-15 NOTE — Telephone Encounter (Signed)
Noted  

## 2017-03-15 NOTE — Telephone Encounter (Signed)
PATIENT WAS A NO SHOW AND LETTER SENT  °

## 2017-06-02 ENCOUNTER — Emergency Department (HOSPITAL_COMMUNITY)
Admission: EM | Admit: 2017-06-02 | Discharge: 2017-06-02 | Disposition: A | Discharge status: Home / Self Care | Payer: Self-pay | Attending: Emergency Medicine | Admitting: Emergency Medicine

## 2017-06-02 ENCOUNTER — Encounter (HOSPITAL_COMMUNITY): Payer: Self-pay

## 2017-06-02 DIAGNOSIS — R112 Nausea with vomiting, unspecified: Secondary | ICD-10-CM

## 2017-06-02 DIAGNOSIS — K219 Gastro-esophageal reflux disease without esophagitis: Secondary | ICD-10-CM | POA: Insufficient documentation

## 2017-06-02 DIAGNOSIS — R197 Diarrhea, unspecified: Secondary | ICD-10-CM | POA: Insufficient documentation

## 2017-06-02 DIAGNOSIS — K861 Other chronic pancreatitis: Secondary | ICD-10-CM | POA: Insufficient documentation

## 2017-06-02 DIAGNOSIS — E860 Dehydration: Secondary | ICD-10-CM

## 2017-06-02 DIAGNOSIS — Z79899 Other long term (current) drug therapy: Secondary | ICD-10-CM | POA: Insufficient documentation

## 2017-06-02 LAB — BASIC METABOLIC PANEL WITH GFR
Anion gap: 6 (ref 5–15)
BUN: 13 mg/dL (ref 6–20)
CO2: 26 mmol/L (ref 22–32)
Calcium: 8 mg/dL — ABNORMAL LOW (ref 8.9–10.3)
Chloride: 104 mmol/L (ref 101–111)
Creatinine, Ser: 0.91 mg/dL (ref 0.61–1.24)
GFR calc Af Amer: >60 mL/min
GFR calc non Af Amer: >60 mL/min
Glucose, Bld: 109 mg/dL — ABNORMAL HIGH (ref 65–99)
Potassium: 3.7 mmol/L (ref 3.5–5.1)
Sodium: 136 mmol/L (ref 135–145)

## 2017-06-02 LAB — CBC WITH DIFFERENTIAL/PLATELET
BASOS ABS: 0 10*3/uL (ref 0.0–0.1)
BASOS PCT: 0 %
EOS PCT: 1 %
Eosinophils Absolute: 0.2 10*3/uL (ref 0.0–0.7)
HCT: 39.2 % (ref 39.0–52.0)
Hemoglobin: 13.7 g/dL (ref 13.0–17.0)
Lymphocytes Relative: 4 %
Lymphs Abs: 0.7 10*3/uL (ref 0.7–4.0)
MCH: 29 pg (ref 26.0–34.0)
MCHC: 34.9 g/dL (ref 30.0–36.0)
MCV: 82.9 fL (ref 78.0–100.0)
MONO ABS: 0.9 10*3/uL (ref 0.1–1.0)
Monocytes Relative: 5 %
Neutro Abs: 15.4 10*3/uL — ABNORMAL HIGH (ref 1.7–7.7)
Neutrophils Relative %: 90 %
PLATELETS: 220 10*3/uL (ref 150–400)
RBC: 4.73 MIL/uL (ref 4.22–5.81)
RDW: 12.7 % (ref 11.5–15.5)
WBC: 17.3 10*3/uL — ABNORMAL HIGH (ref 4.0–10.5)

## 2017-06-02 MED ORDER — ONDANSETRON HCL 4 MG/2ML IJ SOLN
4.0000 mg | Freq: Once | INTRAMUSCULAR | Status: AC
  Administered 2017-06-02: 4 mg via INTRAVENOUS
  Filled 2017-06-02: qty 2

## 2017-06-02 MED ORDER — SODIUM CHLORIDE 0.9 % IV BOLUS (SEPSIS)
500.0000 mL | Freq: Once | INTRAVENOUS | Status: AC
Start: 2017-06-02 — End: 2017-06-02
  Administered 2017-06-02: 500 mL via INTRAVENOUS

## 2017-06-02 MED ORDER — LOPERAMIDE HCL 2 MG PO CAPS
4.0000 mg | ORAL_CAPSULE | Freq: Once | ORAL | Status: AC
  Administered 2017-06-02: 4 mg via ORAL
  Filled 2017-06-02: qty 2

## 2017-06-02 MED ORDER — ONDANSETRON HCL 4 MG PO TABS: 4.0000 mg | ORAL_TABLET | Freq: Three times a day (TID) | ORAL | 0 refills | Status: DC | PRN

## 2017-06-02 MED ORDER — SODIUM CHLORIDE 0.9 % IV BOLUS (SEPSIS)
1000.0000 mL | Freq: Once | INTRAVENOUS | Status: AC
Start: 2017-06-02 — End: 2017-06-02
  Administered 2017-06-02: 1000 mL via INTRAVENOUS

## 2017-06-02 MED ORDER — SODIUM CHLORIDE 0.9 % IV BOLUS (SEPSIS)
1000.0000 mL | Freq: Once | INTRAVENOUS | Status: AC
  Administered 2017-06-02: 1000 mL via INTRAVENOUS

## 2017-06-02 NOTE — ED Triage Notes (Signed)
Pt states his children have been sick with a stomach bug and tonight he started having diarrhea, stomach cramping and nausea.

## 2017-06-02 NOTE — ED Provider Notes (Signed)
[facility]EM[facility]Y DEPT Provider Note   CSN: [id number] Arrival date & time: 06/02/17  0022  Time seen 01:25 AM    History   Chief Complaint Chief Complaint  Patient presents with  . Emesis  . Diarrhea    HPI Zachary Hartman is a 31 y.o. male.  HPI  patient states his 2 stepchildren started having vomiting on September 30 and October 2. He states they are now improved. He states however tonight about 9 PM he had acute onset of nausea and has had about 4 episodes of watery diarrhea. He denies any abdominal pain but states he gets some cramping before the diarrhea. He denies fever. He states he felt dizzy and lightheaded which is why he called EMS.  PCP Jacquelin Hawking, PA-C   Past Medical History:  Diagnosis Date  . Acute pancreatitis 2013  . GERD (gastroesophageal reflux disease)   . Migraines     Patient Active Problem List   Diagnosis Date Noted  . Anxiety 03/10/2016  . Underweight 03/10/2016  . H. pylori infection 03/10/2016  . Esophageal reflux 02/19/2016  . Abdominal pain, epigastric 02/19/2016    Past Surgical History:  Procedure Laterality Date  . WISDOM TOOTH EXTRACTION     March 2018 - 3 teeth extracted       Home Medications    Prior to Admission medications   Medication Sig Start Date End Date Taking? Authorizing Provider  amoxicillin (AMOXIL) 500 MG capsule Take 500 mg by mouth daily.    [provider]  ondansetron (ZOFRAN) 4 MG tablet Take 1 tablet (4 mg total) by mouth every 8 (eight) hours as needed. 06/02/17   Devoria Albe, MD  pantoprazole (PROTONIX) 40 MG tablet Take 1 tablet (40 mg total) by mouth daily. Patient not taking: Reported on 12/29/2016 10/10/16   Jacquelin Hawking, PA-C    Family History Family History  Problem Relation Age of Onset  . Fibroids Mother   . Cancer Sister        Thyroid Cancer  . Diabetes Paternal Grandfather   . Colon cancer Neg Hx   . Gastric cancer Neg Hx   . Esophageal cancer Neg Hx     Social  History Social History  Substance Use Topics  . Smoking status: Never Smoker  . Smokeless tobacco: Never Used  . 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    Physical Exam Updated Vital Signs BP 134/77 (BP Location: Left Arm)   Pulse (!) 128   Temp 98.1 F (36.7 C) (Oral)   Resp (!) 22   Ht  (1.727 m)   Wt 50.3 kg (111 lb)   SpO2 100%   BMI 16.88 kg/m   Vital signs normal except tachycardia   Physical Exam  Constitutional: He is oriented to person, place, and time. He appears well-developed and well-nourished. He is cooperative.  Non-toxic appearance. He does not appear ill. He appears distressed.  Dry heaving  HENT:  Head: Normocephalic and atraumatic.  Right Ear: External ear normal.  Left Ear: External ear normal.  Nose: Nose normal. No mucosal edema or rhinorrhea.  Mouth/Throat: Mucous membranes are dry. No dental abscesses or uvula swelling.  Eyes: Pupils are equal, round, and reactive to light. Conjunctivae and EOM are normal.  Neck: Normal range of motion and full passive range of  motion without pain. Neck supple.  Cardiovascular: Normal rate, regular rhythm and normal heart sounds.  Exam reveals no gallop and no friction rub.   No murmur heard. Pulmonary/Chest: Effort normal and breath sounds normal. No tachypnea. No respiratory distress. He has no wheezes. He has no rhonchi. He has no rales. He exhibits no tenderness and no crepitus.  Abdominal: Soft. Normal appearance and bowel sounds are normal. He exhibits no distension. There is no tenderness. There is no rebound and no guarding.  Musculoskeletal: Normal range of motion. He exhibits no edema or deformity.  Neurological: He is alert and oriented to person, place, and time. No cranial nerve deficit.  Skin: Skin is warm, dry and  intact. No rash noted. No erythema. No pallor.  Psychiatric: He has a normal mood and affect. His speech is normal and behavior is normal. Thought content normal. His mood appears not anxious.  Nursing note and vitals reviewed.    ED Treatments / Results  Labs (all labs ordered are listed, but only abnormal results are displayed) Results for orders placed or performed during the hospital encounter of 06/02/17  Basic metabolic panel  Result Value Ref Range   Sodium 136 135 - 145 mmol/L   Potassium 3.7 3.5 - 5.1 mmol/L   Chloride 104 101 - 111 mmol/L   CO2 26 22 - 32 mmol/L   Glucose, Bld 109 (H) 65 - 99 mg/dL   BUN 13 6 - 20 mg/dL   Creatinine, Ser 1.61 0.61 - 1.24 mg/dL   Calcium 8.0 (L) 8.9 - 10.3 mg/dL   GFR calc non Af Amer >60 >60 mL/min   GFR calc Af Amer >60 >60 mL/min   Anion gap 6 5 - 15  CBC with Differential  Result Value Ref Range   WBC 17.3 (H) 4.0 - 10.5 K/uL   RBC 4.73 4.22 - 5.81 MIL/uL   Hemoglobin 13.7 13.0 - 17.0 g/dL   HCT 09.6 04.5 - 40.9 %   MCV 82.9 78.0 - 100.0 fL   MCH 29.0 26.0 - 34.0 pg   MCHC 34.9 30.0 - 36.0 g/dL   RDW 81.1 91.4 - 78.2 %   Platelets 220 150 - 400 K/uL   Neutrophils Relative % 90 %   Neutro Abs 15.4 (H) 1.7 - 7.7 K/uL   Lymphocytes Relative 4 %   Lymphs Abs 0.7 0.7 - 4.0 K/uL   Monocytes Relative 5 %   Monocytes Absolute 0.9 0.1 - 1.0 K/uL   Eosinophils Relative 1 %   Eosinophils Absolute 0.2 0.0 - 0.7 K/uL   Basophils Relative 0 %   Basophils Absolute 0.0 0.0 - 0.1 K/uL   Laboratory interpretation all normal except Leukocytosis    EKG  EKG Interpretation None       Radiology No results found.  Procedures Procedures (including critical care time)  Medications Ordered in ED Medications  sodium chloride 0.9 % bolus 1,000 mL (0 mLs Intravenous Stopped 06/02/17 0312)  sodium chloride 0.9 % bolus 500 mL (0 mLs Intravenous Stopped 06/02/17 0337)  ondansetron (ZOFRAN) injection 4 mg (4 mg Intravenous Given 06/02/17  0147)  loperamide (IMODIUM) capsule 4 mg (4 mg Oral Given 06/02/17 0148)  sodium chloride 0.9 % bolus 1,000 mL (0 mLs Intravenous Stopped 06/02/17 0526)  ondansetron (ZOFRAN) injection 4 mg (4 mg Intravenous Given 06/02/17 0336)  ondansetron (ZOFRAN) injection 4 mg (4 mg Intravenous Given 06/02/17 0530)     Initial Impression / Assessment and Plan / ED Course  I  have reviewed the triage vital signs and the nursing notes.  Pertinent labs & imaging results that were available during my care of the patient were reviewed by me and considered in my medical decision making (see chart for details).     Pt was started on IV fluids, IV nausea and oral anti-diarrhea meds.   Recheck at 3:25 AM patient states he still has some nausea, he has not had any more diarrhea. He is starting to have urinary output. He was given more IV fluids and more IV nausea medication.  Recheck 05:30 AM patient is now sitting upright in the stretcher wears before he was always laying on the side of the bed. He states he's feeling better. He still has some mild nausea. He has had no vomiting. He states he did feel an urge to have diarrhea but nothing happened. He has been sipping water. He states he's having good urinary output. He was given an additional dose of Zofran. Hopefully he will be able to be discharged soon.  Recheck at 6:15 AM patient states he feels good enough to be discharged home now.  Final Clinical Impressions(s) / ED Diagnoses   Final diagnoses:  Nausea vomiting and diarrhea  Dehydration    New Prescriptions New Prescriptions   ONDANSETRON (ZOFRAN) 4 MG TABLET    Take 1 tablet (4 mg total) by mouth every 8 (eight) hours as needed.  OTC imodium   Plan discharge  Devoria Albe, MD, Concha Pyo, MD 06/02/17 [phone]

## 2017-06-02 NOTE — Discharge Instructions (Signed)
Drink plenty of fluids (clear liquids) then start a bland diet later this morning such as toast, crackers, jello, [employer] chicken noodle soup. Use the zofran for nausea or vomiting. Take imodium OTC for diarrhea. Avoid milk products until the diarrhea is gone. ° °

## 2017-11-04 IMAGING — CR DG ABDOMEN 1V
1 series · 1 of 1 positions shown · non-contrast
Comparison: None.

CLINICAL DATA: Left upper quadrant pain

EXAM:
ABDOMEN - 1 VIEW

[abdomen kub]
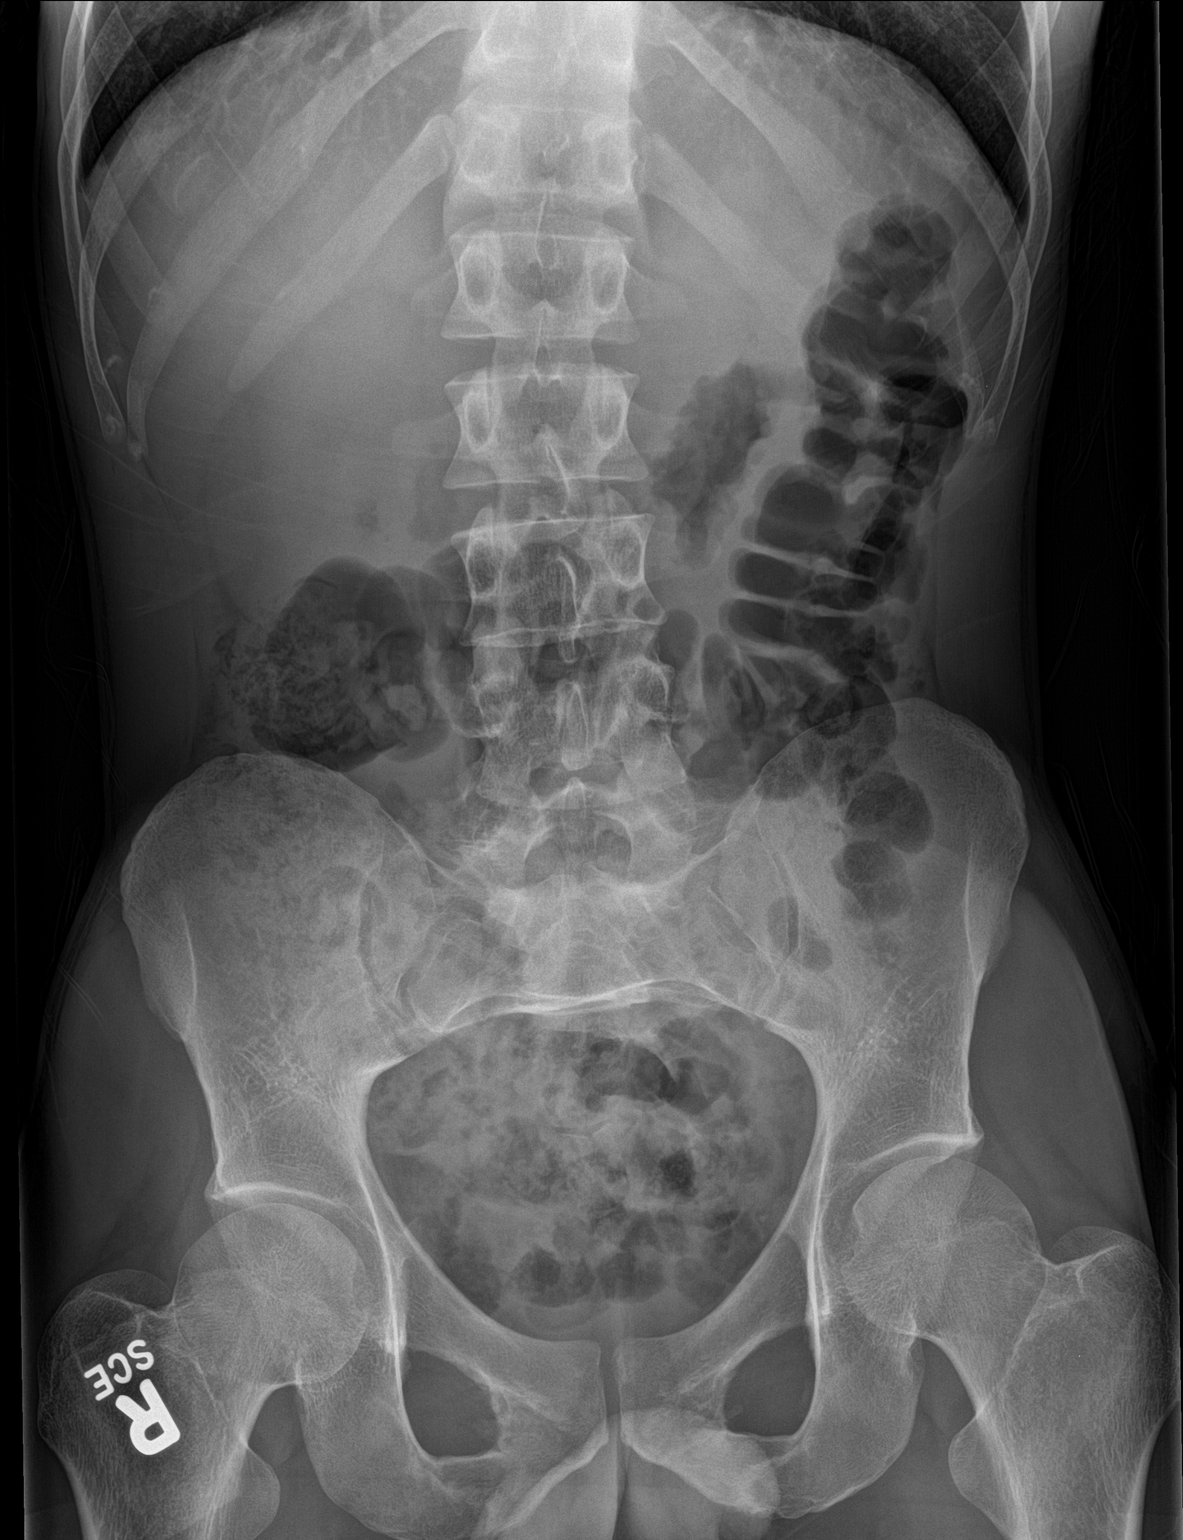

[1 of 1 positions shown; findings below may reference images not displayed]

FINDINGS: The bowel gas pattern is normal. No radio-opaque calculi or other
significant radiographic abnormality are seen.
IMPRESSION: No acute abnormality noted.

## 2017-11-27 ENCOUNTER — Encounter: Payer: Self-pay | Admitting: Physician Assistant

## 2018-01-02 ENCOUNTER — Ambulatory Visit: Payer: Self-pay | Admitting: Physician Assistant

## 2018-01-02 ENCOUNTER — Encounter: Payer: Self-pay | Admitting: Physician Assistant

## 2018-01-02 VITALS — BP 116/71 | HR 73 | Temp 98.1°F | Ht 66.5 in | Wt 103.0 lb

## 2018-01-02 DIAGNOSIS — J302 Other seasonal allergic rhinitis: Secondary | ICD-10-CM

## 2018-01-02 DIAGNOSIS — R636 Underweight: Secondary | ICD-10-CM

## 2018-01-02 DIAGNOSIS — Z0000 Encounter for general adult medical examination without abnormal findings: Secondary | ICD-10-CM

## 2018-01-02 NOTE — Progress Notes (Signed)
BP 116/71 (BP Location: Right Arm, Patient Position: Sitting, Cuff Size: Normal)   Pulse 73   Temp 98.1 F (36.7 C) (Other (Comment))   Ht 5' 6.5" (1.689 m)   Wt 103 lb (46.7 kg)   SpO2 98%   BMI 16.38 kg/m    Subjective:    Patient ID: Zachary Hartman, male    DOB: 1986/03/23, 32 y.o.   MRN: [id number]  HPI: Zachary Hartman is a 32 y.o. male presenting on 01/02/2018 for Follow-up   HPI Pt says he is doing well.  He is having no problems with eating- swallowing or reflux.  He is 103 lb today.  He was 101 last May.   Pt is working as Civil Service fast streamer for [employer]  He is taking claritin for some allergies.  It is working well for him  Relevant past medical, surgical, family and social history reviewed and updated as indicated. Interim medical history since our last visit reviewed. Allergies and medications reviewed and updated.  CURRENT MEDS: claritin  Review of Systems  Constitutional: Negative for appetite change, chills, diaphoresis, fatigue, fever and unexpected weight change.  HENT: Negative for congestion, dental problem, drooling, ear pain, facial swelling, hearing loss, mouth sores, sneezing, sore throat, trouble swallowing and voice change.   Eyes: Negative for pain, discharge, redness, itching and visual disturbance.  Respiratory: Negative for cough, choking, shortness of breath and wheezing.   Cardiovascular: Negative for chest pain, palpitations and leg swelling.  Gastrointestinal: Negative for abdominal pain, blood in stool, constipation, diarrhea and vomiting.  Endocrine: Negative for cold intolerance, heat intolerance and polydipsia.  Genitourinary: Negative for decreased urine volume, dysuria and hematuria.  Musculoskeletal: Negative for arthralgias, back pain and gait problem.  Skin: Negative for rash.  Allergic/Immunologic: Negative for environmental allergies.  Neurological: Negative for seizures, syncope, light-headedness and headaches.   Hematological: Negative for adenopathy.  Psychiatric/Behavioral: Negative for agitation, dysphoric mood and suicidal ideas. The patient is not nervous/anxious.     Per HPI unless specifically indicated above     Objective:    BP 116/71 (BP Location: Right Arm, Patient Position: Sitting, Cuff Size: Normal)   Pulse 73   Temp 98.1 F (36.7 C) (Other (Comment))   Ht 5' 6.5" (1.689 m)   Wt 103 lb (46.7 kg)   SpO2 98%   BMI 16.38 kg/m   Wt Readings from Last 3 Encounters:  01/02/18 103 lb (46.7 kg)  06/02/17 111 lb (50.3 kg)  12/29/16 101 lb (45.8 kg)    Physical Exam  Constitutional: He is oriented to person, place, and time. Vital signs are normal. He is cooperative.  Pt is thin and frail but appears to feel well.  He is smiling and pleasant.  HENT:  Head: Normocephalic and atraumatic.  Neck: Neck supple.  Cardiovascular: Normal rate and regular rhythm.  Pulmonary/Chest: Effort normal and breath sounds normal. He has no wheezes.  Abdominal: Soft. Bowel sounds are normal. There is no hepatosplenomegaly. There is no tenderness.  Musculoskeletal: He exhibits no edema.  Lymphadenopathy:    He has no cervical adenopathy.  Neurological: He is alert and oriented to person, place, and time.  Skin: Skin is warm and dry.  Psychiatric: He has a normal mood and affect. His behavior is normal.  Vitals reviewed.        Assessment & Plan:    Encounter Diagnoses  Name Primary?  . Well adult exam Yes  . Seasonal allergies   . Underweight  Pt counseled to continue eating and follow up 1 year.  RTO sooner prn

## 2018-01-08 IMAGING — CR DG CHEST 2V
2 series · 2 of 2 positions shown · non-contrast
Comparison: 06/04/2015

CLINICAL DATA: Left side rib pain starting 2 p.m. today

EXAM:
CHEST  2 VIEW

[chest pa]
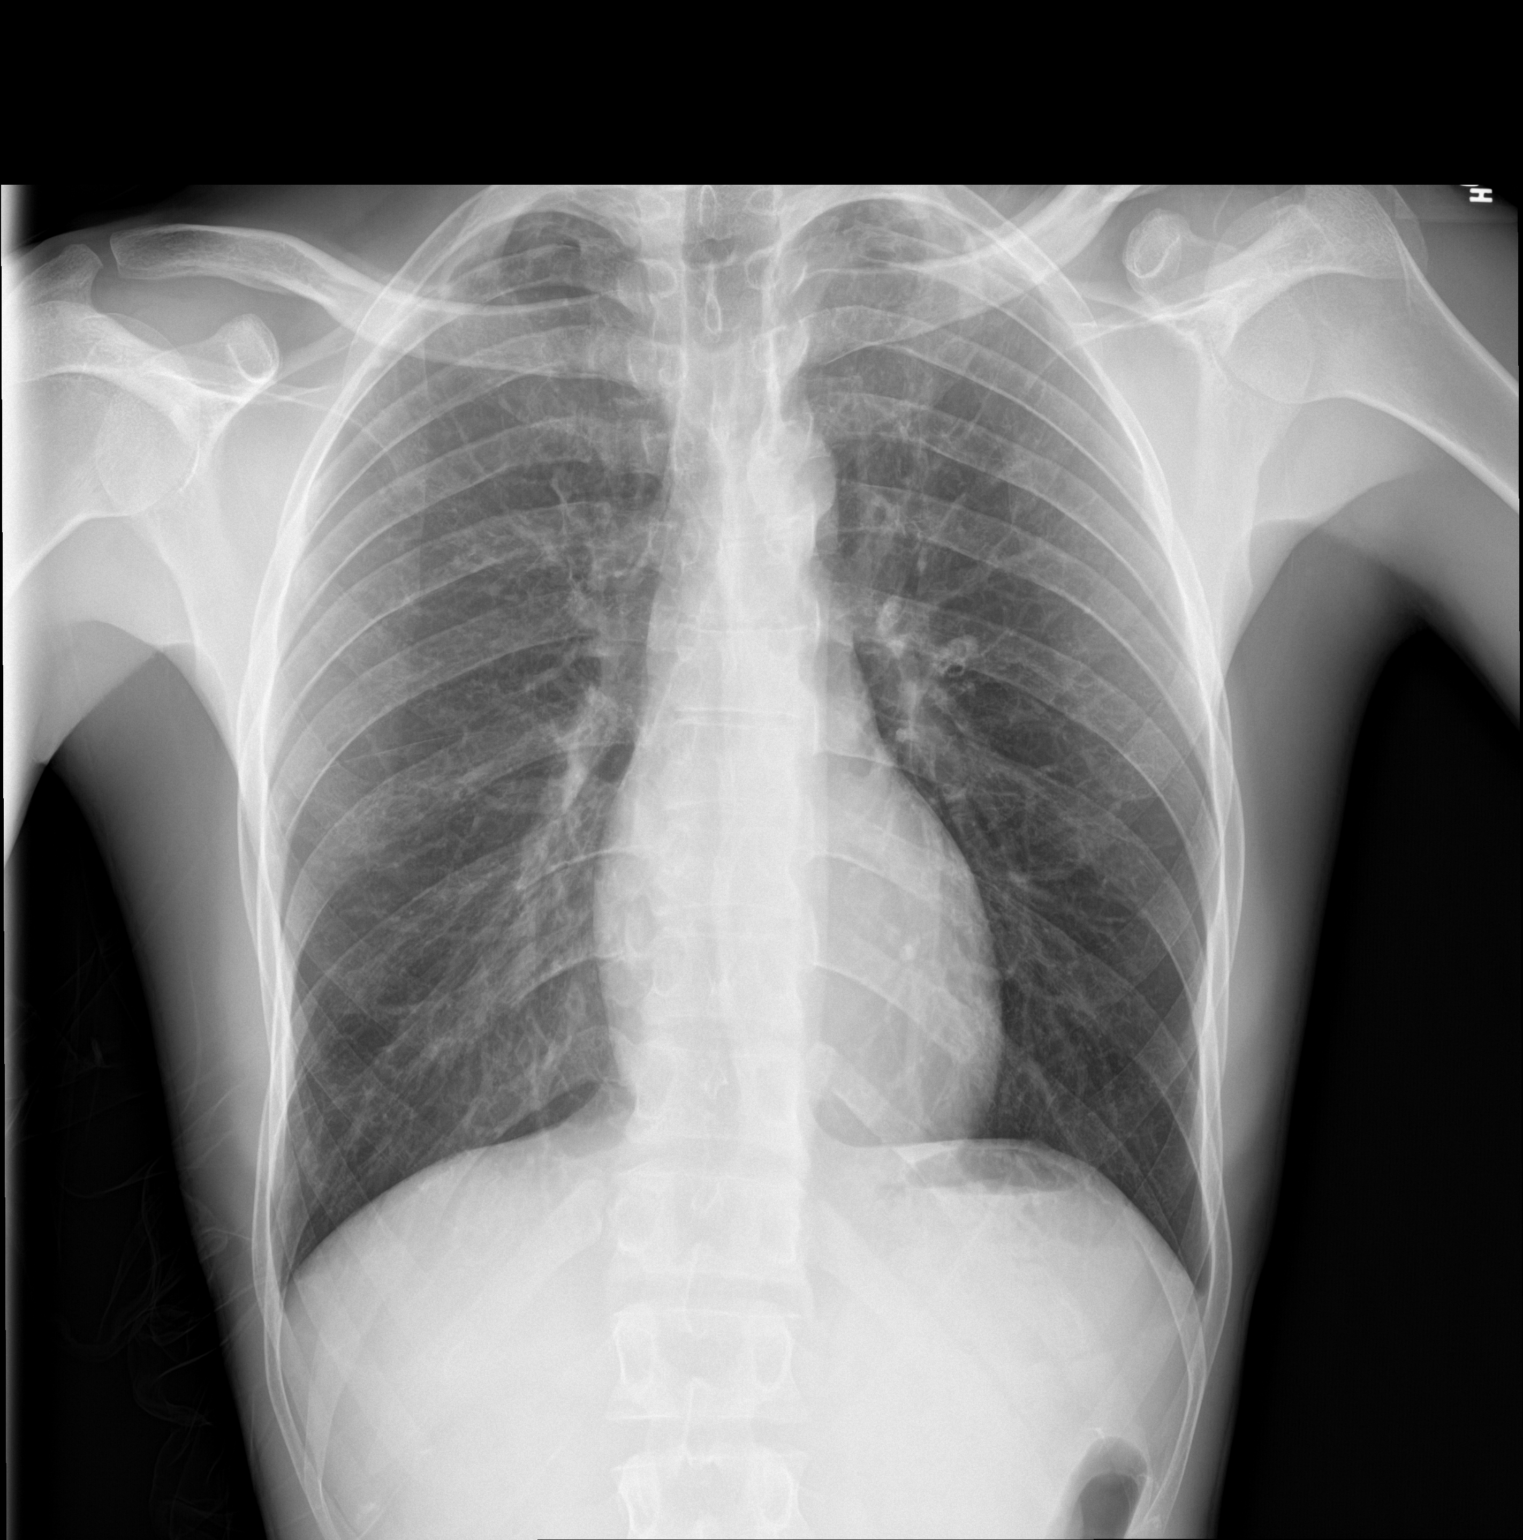

[chest lat]
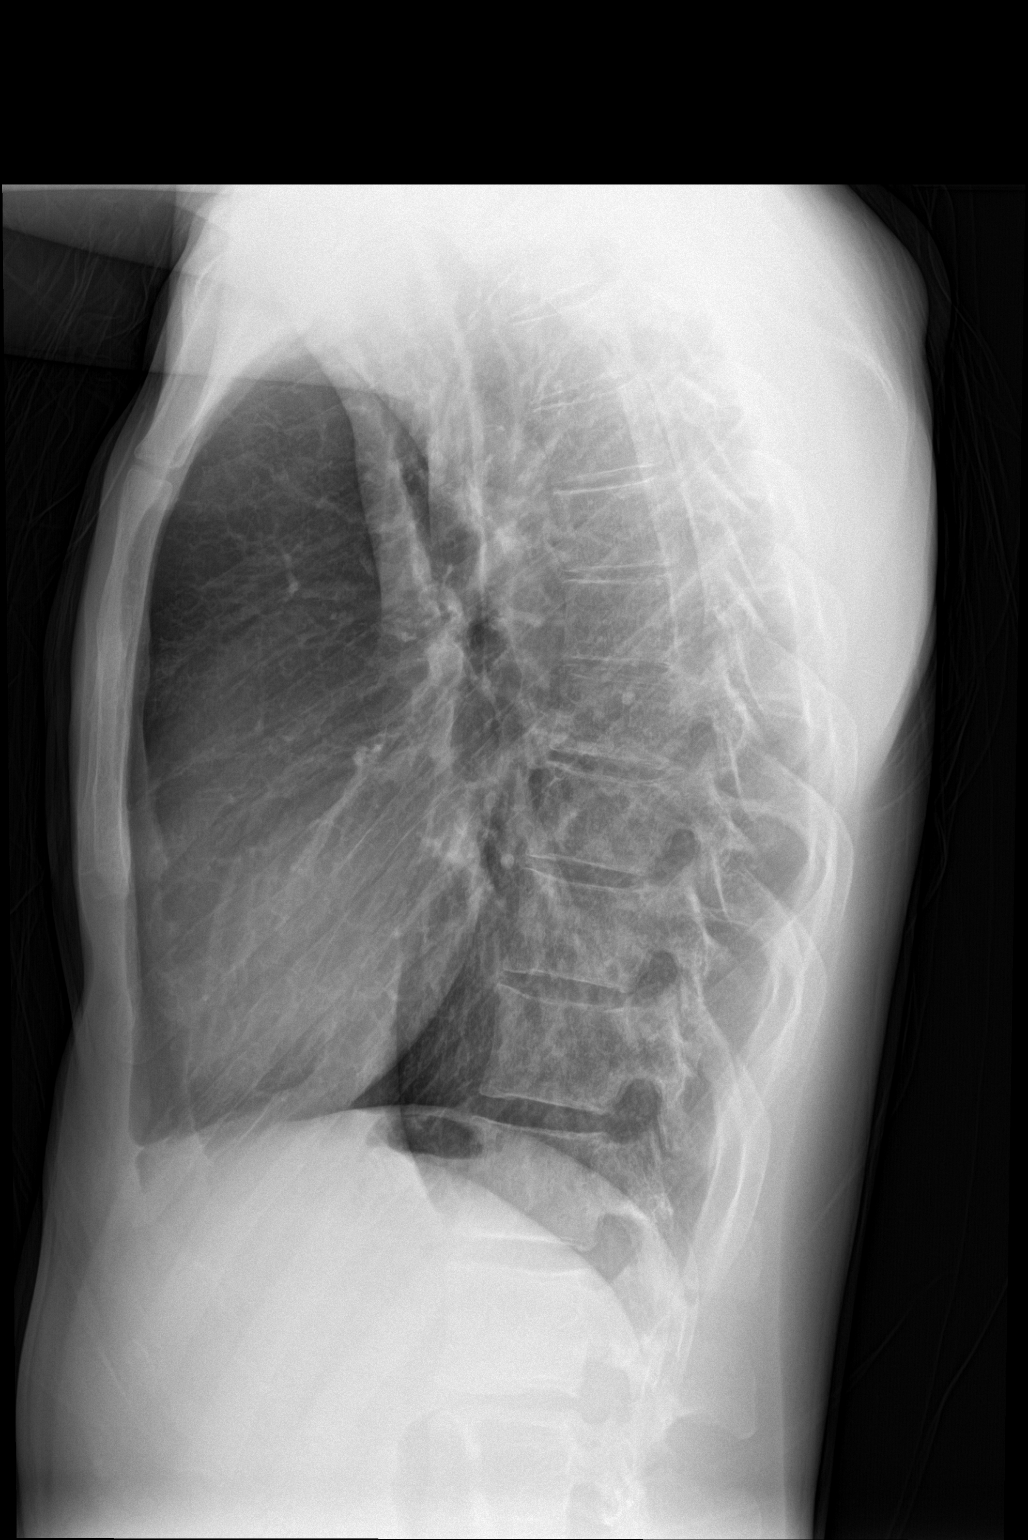

[2 of 2 positions shown; findings below may reference images not displayed]

FINDINGS: Cardiomediastinal silhouette is stable. No acute infiltrate or
pleural effusion. No pulmonary edema. Mild hyperinflation again
noted. There is no pneumothorax.
IMPRESSION: No active cardiopulmonary disease.

## 2018-06-05 LAB — HM HIV SCREENING LAB: HM HIV SCREENING: NEGATIVE

## 2019-01-03 ENCOUNTER — Ambulatory Visit: Payer: Self-pay | Admitting: Physician Assistant

## 2019-01-03 IMAGING — DX DG ABDOMEN ACUTE W/ 1V CHEST
3 series · 3 of 3 positions shown · non-contrast
Comparison: 10/20/2015

CLINICAL DATA: Nausea

EXAM:
DG ABDOMEN ACUTE W/ 1V CHEST

[abdomen erect]
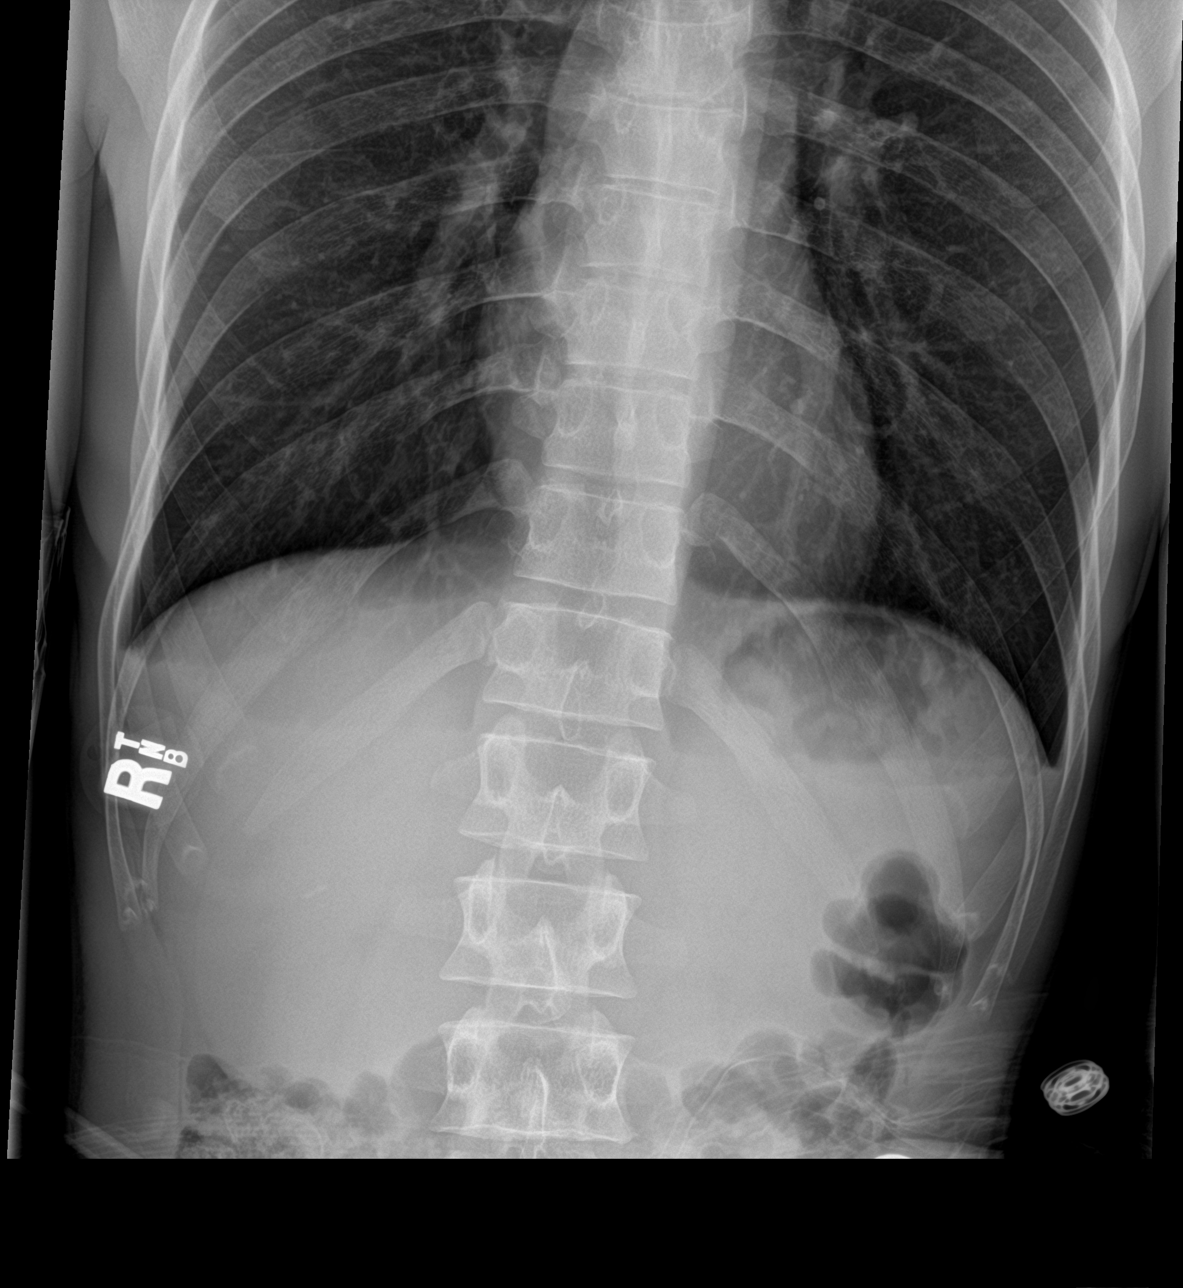

[abdomen supine]
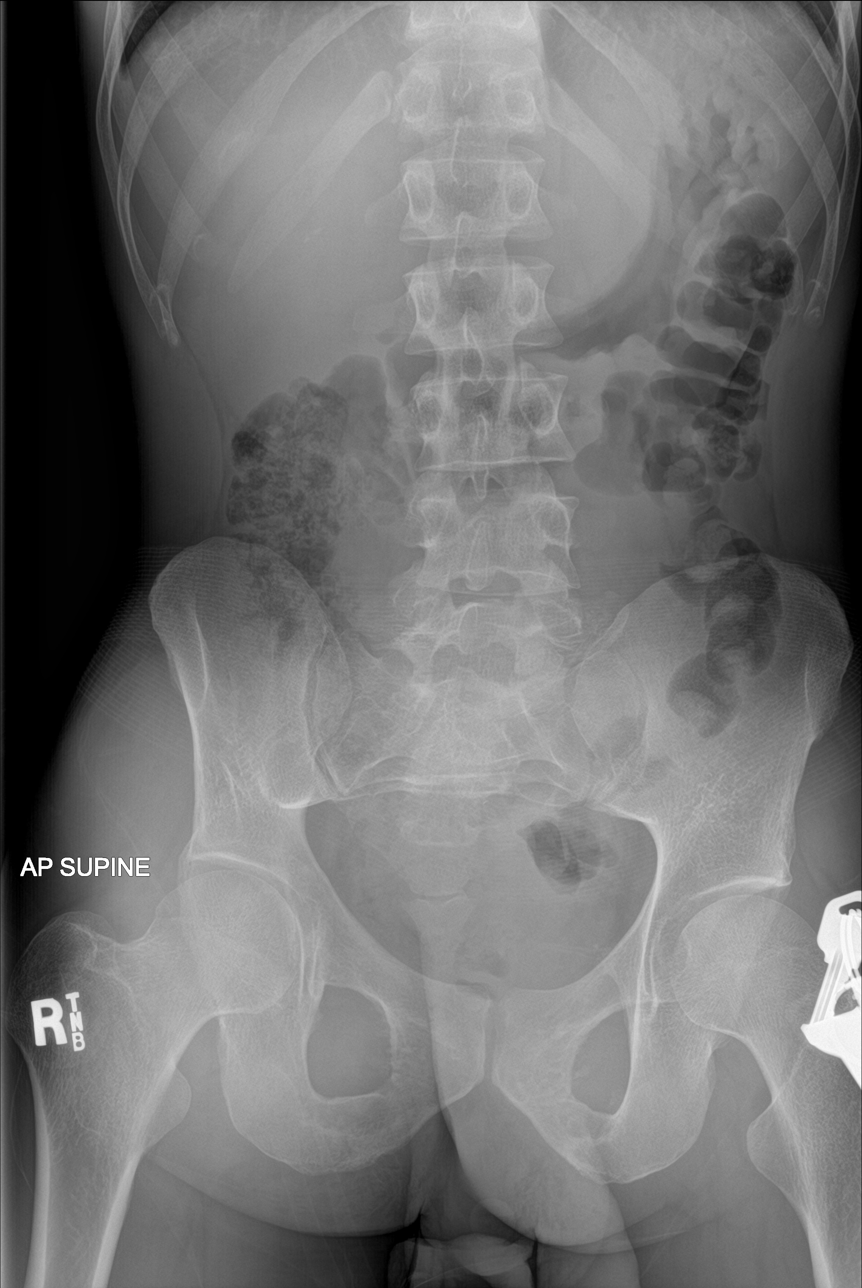

[chest ap]
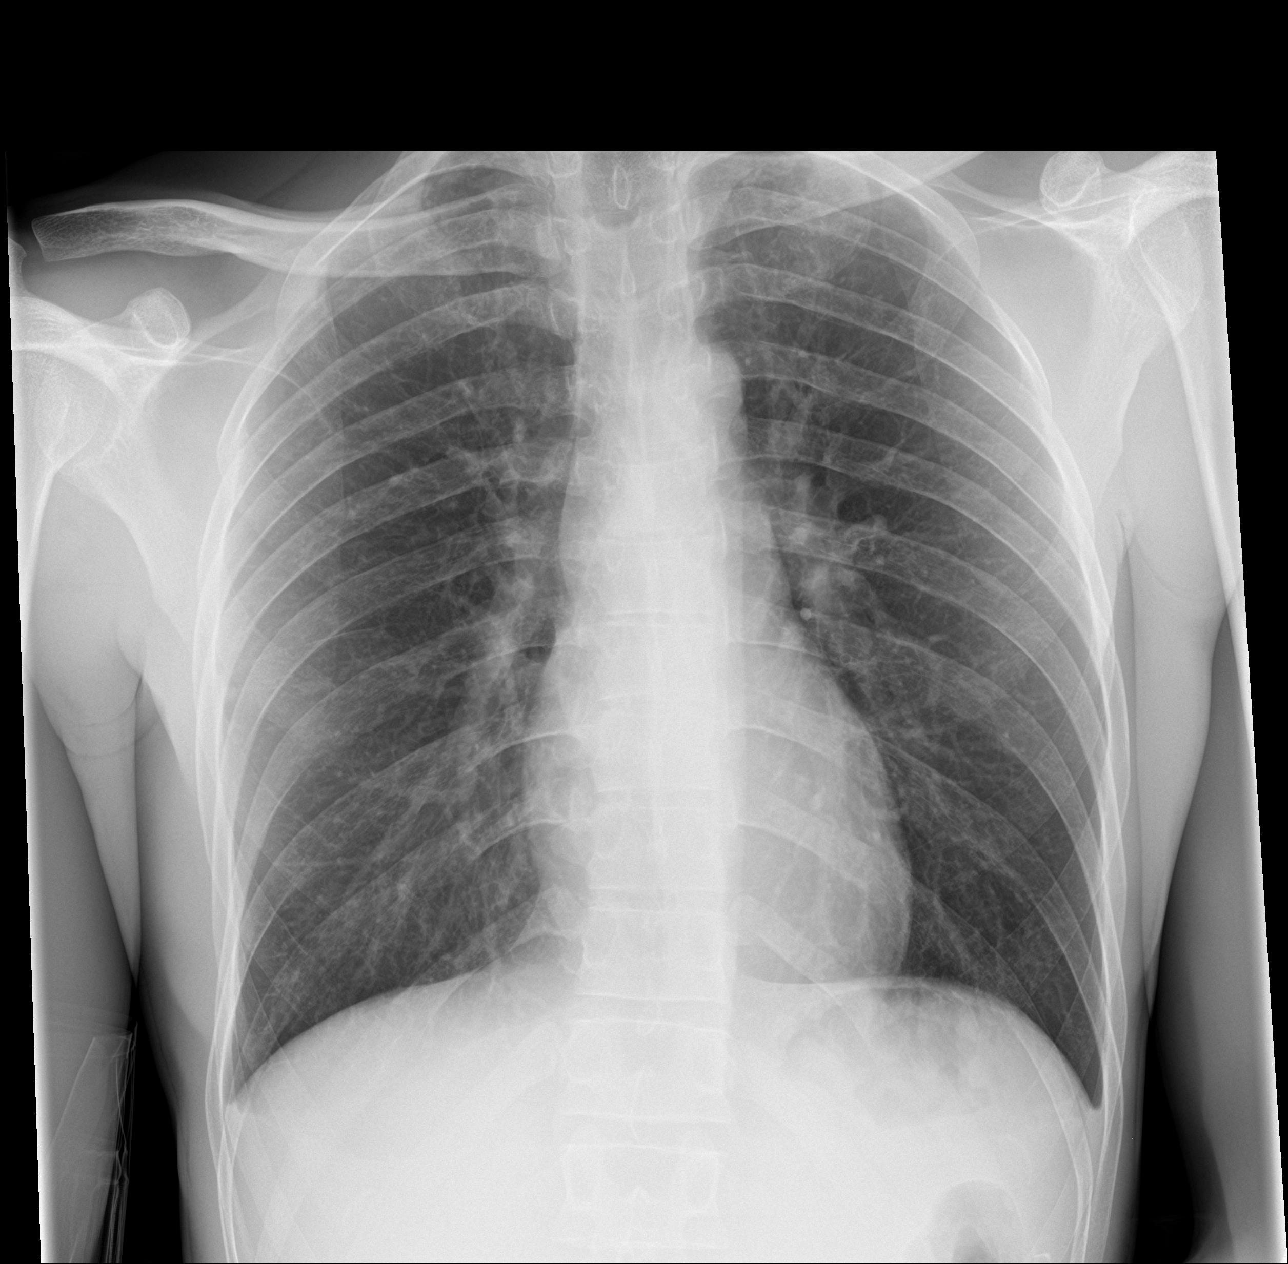

[3 of 3 positions shown; findings below may reference images not displayed]

FINDINGS: Cardiac shadow is within normal limits. The lungs are clear
bilaterally.

Scattered large and small bowel gas is noted. No obstructive changes
are seen. Calcifications are noted over the outline of the right
kidney which may represent nonobstructing stones although may be
related to cartilaginous calcification. No other focal abnormality
is noted.
IMPRESSION: Calcifications over the right renal outline which may be related to
nonobstructing stones or cartilage calcifications.

## 2019-05-08 ENCOUNTER — Other Ambulatory Visit: Payer: Self-pay

## 2019-05-08 DIAGNOSIS — Z20822 Contact with and (suspected) exposure to covid-19: Secondary | ICD-10-CM

## 2019-05-08 NOTE — Addendum Note (Signed)
Addended by: Marvene Staff on: 05/08/2019 10:25 AM   Modules accepted: Orders

## 2019-05-09 LAB — NOVEL CORONAVIRUS, NAA: SARS-CoV-2, NAA: NOT DETECTED

## 2019-05-13 ENCOUNTER — Ambulatory Visit: Payer: Self-pay | Admitting: Physician Assistant

## 2019-05-13 ENCOUNTER — Other Ambulatory Visit: Payer: Self-pay

## 2019-05-13 ENCOUNTER — Encounter: Payer: Self-pay | Admitting: Physician Assistant

## 2019-05-13 DIAGNOSIS — Z113 Encounter for screening for infections with a predominantly sexual mode of transmission: Secondary | ICD-10-CM

## 2019-05-13 DIAGNOSIS — Z202 Contact with and (suspected) exposure to infections with a predominantly sexual mode of transmission: Secondary | ICD-10-CM

## 2019-05-13 MED ORDER — AZITHROMYCIN 500 MG PO TABS: 500.0000 mg | ORAL_TABLET | Freq: Once | ORAL | Status: DC

## 2019-05-13 NOTE — Progress Notes (Signed)
Here today for STD screening. Accepts bloodwork. Hal Morales, RN  Patient declined medication today stating "he isn't comfortable taking medication if there is not a valid test result." RN counsled that provider has recommended medication due to examination and patient complaint of testicle pain. Patient continues to decline medication and opts to wait for test results. Provider C. [place], [place] notified of above. Hal Morales, RN

## 2019-05-13 NOTE — Progress Notes (Signed)
    STI clinic/screening visit  Subjective:  Zachary Hartman is a 33 y.o. male being seen today for an STI screening visit. The patient reports they do have symptoms.  Patient has the following medical conditions:   Patient Active Problem List   Diagnosis Date Noted  . Anxiety 03/10/2016  . Underweight 03/10/2016  . H. pylori infection 03/10/2016  . Esophageal reflux 02/19/2016  . Abdominal pain, epigastric 02/19/2016     Chief Complaint  Patient presents with  . SEXUALLY TRANSMITTED DISEASE    HPI  Patient reports that he has had a "swollen feeling" pain in the testicles off and on for 2 weeks.  Has had 2 partners and not using condoms with all sex.  See flowsheet for further details and programmatic requirements.    The following portions of the patient's history were reviewed and updated as appropriate: allergies, current medications, past medical history, past social history, past surgical history and problem list.  Objective:  There were no vitals filed for this visit.  Physical Exam Constitutional:      General: He is not in acute distress.    Appearance: Normal appearance.  HENT:     Head: Normocephalic and atraumatic.     Mouth/Throat:     Mouth: Mucous membranes are moist.     Pharynx: Oropharynx is clear. No oropharyngeal exudate or posterior oropharyngeal erythema.  Eyes:     Conjunctiva/sclera: Conjunctivae normal.  Neck:     Musculoskeletal: Neck supple.  Pulmonary:     Effort: Pulmonary effort is normal.  Abdominal:     Palpations: Abdomen is soft. There is no mass.     Tenderness: There is no abdominal tenderness. There is no guarding or rebound.  Genitourinary:    Penis: Normal.      Scrotum/Testes: Normal.     Comments: Pubic area without nits, lice, edema, erythema, lesions, and inguinal adenopathy. Penis circumcised and without discharge at meatus. Testicles descended bilaterally, nt, no masses palpated on exam. Lymphadenopathy:   Cervical: No cervical adenopathy.  Skin:    General: Skin is warm and dry.     Findings: No bruising, erythema, lesion or rash.  Neurological:     Mental Status: He is alert and oriented to person, place, and time.  Psychiatric:        Mood and Affect: Mood normal.        Behavior: Behavior normal.        Thought Content: Thought content normal.        Judgment: Judgment normal.       Assessment and Plan:  MISHAEL HARAN is a 33 y.o. male presenting to the [facility] Department for STI screening  1. Screening for STD (sexually transmitted disease) Patient c/o testicular pain off and on for a couple of weeks. Rec condoms with all sex Await test results.  Counseled that RN will call if needs to RTC for treatment once results are back. - HIV [facility] - Syphilis Serology, [facility] - Ct, Ng, Mycoplasmas NAA, Urine  2. Venereal disease contact Due to patient concern and risks will cover for possible Chlamydia with Azithromycin 1g po DOT today. No sex for 7 days and until after partner completes treatment. - azithromycin (ZITHROMAX) tablet 500 mg     No follow-ups on file.  No future appointments.  Jerene Dilling, PA

## 2019-05-16 LAB — CT, NG, MYCOPLASMAS NAA, URINE
Chlamydia trachomatis, NAA: NEGATIVE
Mycoplasma genitalium NAA: NEGATIVE
Mycoplasma hominis NAA: NEGATIVE
Neisseria gonorrhoeae, NAA: NEGATIVE
Ureaplasma spp NAA: POSITIVE — AB

## 2019-05-20 ENCOUNTER — Telehealth: Payer: Self-pay

## 2019-05-20 NOTE — Telephone Encounter (Signed)
TC to patient. Verified ID via password/SS#. Patient had appt on 05/17/19 but [facility] for treatment.  Instructed to eat before visit and have partner call for tx appt. Appt scheduled for 05/21/19. Aileen Fass, RN

## 2019-05-29 ENCOUNTER — Telehealth: Payer: Self-pay

## 2019-06-04 NOTE — Telephone Encounter (Signed)
Closed to f/u Ranada Vigorito, RN  

## 2019-06-24 ENCOUNTER — Other Ambulatory Visit: Payer: Self-pay

## 2019-06-24 DIAGNOSIS — Z20822 Contact with and (suspected) exposure to covid-19: Secondary | ICD-10-CM

## 2019-06-25 LAB — NOVEL CORONAVIRUS, NAA: SARS-CoV-2, NAA: NOT DETECTED

## 2019-08-07 ENCOUNTER — Other Ambulatory Visit: Payer: Self-pay

## 2019-08-07 DIAGNOSIS — Z20822 Contact with and (suspected) exposure to covid-19: Secondary | ICD-10-CM

## 2019-08-08 LAB — NOVEL CORONAVIRUS, NAA: SARS-CoV-2, NAA: NOT DETECTED

## 2019-08-27 ENCOUNTER — Ambulatory Visit: Payer: Self-pay

## 2019-09-04 ENCOUNTER — Ambulatory Visit: Payer: MEDICAID | Attending: Internal Medicine

## 2019-09-04 DIAGNOSIS — Z20822 Contact with and (suspected) exposure to covid-19: Secondary | ICD-10-CM

## 2019-09-05 LAB — NOVEL CORONAVIRUS, NAA: SARS-CoV-2, NAA: NOT DETECTED

## 2019-09-09 ENCOUNTER — Ambulatory Visit: Payer: Self-pay | Admitting: Physician Assistant

## 2019-09-09 ENCOUNTER — Other Ambulatory Visit: Payer: Self-pay

## 2019-09-09 DIAGNOSIS — Z113 Encounter for screening for infections with a predominantly sexual mode of transmission: Secondary | ICD-10-CM

## 2019-09-10 ENCOUNTER — Encounter: Payer: Self-pay | Admitting: Physician Assistant

## 2019-09-10 NOTE — Progress Notes (Signed)
[facility] Department STI clinic/screening visit  Subjective:  Zachary Hartman is a 34 y.o. male being seen today for an STI screening visit. The patient reports they do have symptoms.    Patient has the following medical conditions:   Patient Active Problem List   Diagnosis Date Noted  . Anxiety 03/10/2016  . Underweight 03/10/2016  . H. pylori infection 03/10/2016  . Esophageal reflux 02/19/2016  . Abdominal pain, epigastric 02/19/2016     Chief Complaint  Patient presents with  . SEXUALLY TRANSMITTED DISEASE    HPI  Patient reports that he has had a rash on his penis, foot, and abdomen but that it has mostly resolved and would like a screening to make sure he does not have something.  States that he did not RTC for treatment for positive Ureaplasma found on testing from last visit.  States that about 1.5 months after last visit, he had a lymph node swell and not resolve.  Reports that he had this lymph node removed on 08/26/2019, and is currently taking 2 different antibiotics, one is Doxycycline but does not know the name of the other one, as prescribed by the surgeon who removed the lymph node.  States that he discussed with the surgeon that he did not get treated for the Ureaplasma and was told that this bacteria could have led to the lymph node swelling.  States that the antibiotics are to treat the Ureaplasma and also to prevent infection at the surgical site.  Last sex 2 months ago.   See flowsheet for further details and programmatic requirements.    The following portions of the patient's history were reviewed and updated as appropriate: allergies, current medications, past medical history, past social history, past surgical history and problem list.  Objective:  There were no vitals filed for this visit.  Physical Exam Constitutional:      General: He is not in acute distress.    Appearance: Normal appearance.  HENT:     Head: Normocephalic and  atraumatic.     Comments: No nits, lice, or hair loss. No cervical, supraclavicular or axillary adenopathy.     Mouth/Throat:     Mouth: Mucous membranes are moist.     Pharynx: Oropharynx is clear. No oropharyngeal exudate or posterior oropharyngeal erythema.  Eyes:     Conjunctiva/sclera: Conjunctivae normal.  Pulmonary:     Effort: Pulmonary effort is normal.  Abdominal:     Palpations: Abdomen is soft. There is no mass.     Tenderness: There is no abdominal tenderness. There is no guarding or rebound.  Genitourinary:    Penis: Normal.      Testes: Normal.     Comments: Pubic area without nits, lice, edema, erythema, lesions and inguinal adenopathy.  Surgical site well healed. Penis without lesions or rash.  Musculoskeletal:     Cervical back: Neck supple. No tenderness.  Skin:    General: Skin is warm and dry.     Findings: Rash present. No bruising, erythema or lesion.     Comments: Fine, sandpaper like area on left side mid-abdomen. No edema, erythema or scaling. Plantar surface of feet with scaling and scattered slightly hyperpigmented areas, nt, no edema, erythema or lesions.  Neurological:     Mental Status: He is alert and oriented to person, place, and time.  Psychiatric:        Mood and Affect: Mood normal.        Behavior: Behavior normal.  Thought Content: Thought content normal.        Judgment: Judgment normal.       Assessment and Plan:  Zachary Hartman is a 34 y.o. male presenting to the [facility] Department for STI screening  1. Screening for STD (sexually transmitted disease) Patient into clinic for screening. Counseled that due to current antibiotic use, accuracy of testing may be affected. Rec condoms with all sex. Enc to complete all antibiotics as directed and to follow up with surgeon per recs. - HIV [facility] - Syphilis Serology,  Lab - GC/Chlamydia Probe Amp([facility])     No follow-ups on file.  No  future appointments.  Matt Holmes, PA

## 2019-09-11 LAB — GC/CHLAMYDIA PROBE AMP
Chlamydia trachomatis, NAA: NEGATIVE
Neisseria Gonorrhoeae by PCR: NEGATIVE

## 2019-09-19 ENCOUNTER — Other Ambulatory Visit: Payer: Self-pay

## 2019-09-19 ENCOUNTER — Ambulatory Visit: Payer: Self-pay | Admitting: Physician Assistant

## 2019-09-19 DIAGNOSIS — Z113 Encounter for screening for infections with a predominantly sexual mode of transmission: Secondary | ICD-10-CM

## 2019-09-19 DIAGNOSIS — A539 Syphilis, unspecified: Secondary | ICD-10-CM

## 2019-09-19 MED ORDER — DOXYCYCLINE HYCLATE 100 MG PO TABS: 100.0000 mg | ORAL_TABLET | Freq: Two times a day (BID) | ORAL | 0 refills | Status: AC

## 2019-09-21 ENCOUNTER — Encounter: Payer: Self-pay | Admitting: Physician Assistant

## 2019-09-21 DIAGNOSIS — A539 Syphilis, unspecified: Secondary | ICD-10-CM | POA: Insufficient documentation

## 2019-09-21 NOTE — Progress Notes (Signed)
S:  Patient into clinic to pick up medicine for treatment of secondary Syphilis.  Per discussion with [facility] and patient on 3 way call, patient was given Bactrim after his surgery and has not taken any Doxycycline.  Patient is allergic to PCN. O:  WDWN male in NAD, A&O x 3.  RPR from 09/09/2019= reactive 1:64, Syphilis TP=reactive. A/P:  1.  Patient needs treatment for Syphilis.  Patient with rash that had almost resolved at 09/09/2019 visit.  Last RPR before 09/09/2019 was NR 05/13/2019. 2.  Doxycycline 100mg  #28 1 po BID for 14 days to treat secondary Syphilis. 3.  Stressed to patient importance of completing all of medicine as directed to treat Syphilis appropriately. 4.  Counseled patient to RTC in 6 mos, 12 mos and annually for titer check. 5.  No sex for 14 days and until after partner completes treatment. 6.  Rec condoms with all sex 7.  Call with questions or concerns.

## 2019-09-25 ENCOUNTER — Telehealth: Payer: Self-pay | Admitting: Physician Assistant

## 2019-09-25 NOTE — Telephone Encounter (Signed)
Call to patient at approximately 8:53am today.  Received a message from [facility] that patient was not able to get through on the agency phone line.  Per [facility] patient reports nausea an vomiting with Doxycycline and wants to know if there is an alternate medicine for his treatment.Patient states that he started his medicine yesterday.  Call to patient and counseled that PCN is first line tx for Syphilis and since he is allergic this is not an option for him.  Counseled that Doxycycline is only approved alternative.  Counseled patient to try drinking milk or eating yogurt prior to coat the stomach and hopefully reduce nausea.  Also, counseled to make sure that he is staying upright for an hour after taking the medicine, drink full 8 oz glass of water when swallowing and to eat when taking medicine.  Try non-sedating antinausea med about 30 min prior to taking dose, peppermint or spearmint candy or gum or sour candy, ginger cookies or ginger ale to also decrease nausea.  Patient verbalizes understanding and agrees with plan.  Patient to call back if continues to have trouble with medicine.

## 2019-10-01 ENCOUNTER — Ambulatory Visit: Payer: MEDICAID | Attending: Internal Medicine

## 2019-10-01 ENCOUNTER — Other Ambulatory Visit: Payer: Medicaid - Out of State

## 2019-10-01 DIAGNOSIS — Z20822 Contact with and (suspected) exposure to covid-19: Secondary | ICD-10-CM

## 2019-10-02 LAB — NOVEL CORONAVIRUS, NAA: SARS-CoV-2, NAA: NOT DETECTED

## 2019-10-10 ENCOUNTER — Ambulatory Visit: Payer: MEDICAID | Attending: Internal Medicine

## 2019-10-10 DIAGNOSIS — Z20822 Contact with and (suspected) exposure to covid-19: Secondary | ICD-10-CM

## 2019-10-11 LAB — NOVEL CORONAVIRUS, NAA: SARS-CoV-2, NAA: NOT DETECTED

## 2019-10-27 NOTE — Progress Notes (Signed)
Chart reviewed by Pharmacist  Suzanne Walker PharmD, Contract Pharmacist at [facility]  

## 2020-01-29 ENCOUNTER — Other Ambulatory Visit: Payer: Medicaid - Out of State

## 2021-05-18 ENCOUNTER — Ambulatory Visit: Payer: Self-pay

## 2021-05-19 ENCOUNTER — Telehealth: Payer: Self-pay | Admitting: Gerontology

## 2021-05-19 NOTE — Telephone Encounter (Signed)
scheduled new pt visit for 9/29

## 2021-05-19 NOTE — Telephone Encounter (Signed)
-----   Message from Efraim Kaufmann, [place] sent at 05/18/2021  4:15 PM EDT ----- Regarding: RE: New Pt Appt Pt needs to bring most recent pay stub ----- Message ----- From: Efraim Kaufmann, CMA Sent: 05/18/2021   4:11 PM EDT To: Cammie Sickle Admin Subject: New Pt Appt                                    Pls call and schedule new pt appt

## 2021-05-27 ENCOUNTER — Other Ambulatory Visit: Payer: Self-pay

## 2021-05-27 ENCOUNTER — Ambulatory Visit: Payer: Self-pay | Admitting: Gerontology

## 2021-05-27 ENCOUNTER — Encounter: Payer: Self-pay | Admitting: Gerontology

## 2021-05-27 VITALS — BP 124/87 | HR 71 | Temp 97.9°F | Resp 16 | Ht 67.0 in | Wt 105.2 lb

## 2021-05-27 DIAGNOSIS — R1013 Epigastric pain: Secondary | ICD-10-CM

## 2021-05-27 DIAGNOSIS — T7840XA Allergy, unspecified, initial encounter: Secondary | ICD-10-CM

## 2021-05-27 DIAGNOSIS — Z7689 Persons encountering health services in other specified circumstances: Secondary | ICD-10-CM

## 2021-05-27 DIAGNOSIS — Z872 Personal history of diseases of the skin and subcutaneous tissue: Secondary | ICD-10-CM

## 2021-05-27 MED ORDER — CETIRIZINE HCL 10 MG PO TABS
10.0000 mg | ORAL_TABLET | Freq: Every day | ORAL | 2 refills | Status: DC
  Filled 2021-05-27: qty 30, 30d supply, fill #0
  Filled 2021-06-24 (×2): qty 30, 30d supply, fill #1

## 2021-05-27 MED ORDER — KETOCONAZOLE 2 % EX SHAM
1.0000 "application " | MEDICATED_SHAMPOO | CUTANEOUS | 1 refills | Status: DC
  Filled 2021-05-27: qty 120, 30d supply, fill #0
  Filled 2021-06-24 (×2): qty 120, 30d supply, fill #1

## 2021-05-27 NOTE — Progress Notes (Signed)
New Patient Office Visit  Subjective:  Patient ID: Zachary Hartman, male    DOB: 1986-03-15  Age: 35 y.o. MRN: [id number]  CC:  Chief Complaint  Patient presents with   Establish Care    Patient c/o allergies and rash on his scalp.   Abdominal Pain    Patient c/o abdominal pain x 3-4 days. Patient had acute pancreatitis in 2016    HPI Zachary Hartman is a 35 y/o male who has history of Pancreatitis, GERD, Migraine presents to establish care and evaluation of his problems. He takes Aspirin for pain as needed, because he's allergic to Ibuprofen and intolerant to Tylenol. He has a history of acid reflux and denies exacerbation for the past 3 years and was treated for H pylori in 2017. 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 He was using ketoconazole shampoo in the past, but uses otc sulphur 8 shampoo with minimal relief. Currently, he c/o of  intermittent dull throbbing non radiating epigastric pain that started 3 days ago. He states that eating food aggravates symptoms but pain is relieved with drinking water. He denies any hematochezia, diarrhea or constipation.Currently, he denies abdominal pain though has not eating prior to his appointment. He denies alcohol consumption but reports having a history of Pancreatitis in the past. He also has a history of seasonal allergy and taking otc loratidine offers minimal relief. Overall, he states that he's doing well and offers no further complaint.  Past Medical History:  Diagnosis Date   Acute pancreatitis 08/30/2011   Allergy    GERD (gastroesophageal reflux disease)    Migraines    Psoriasis of scalp     Past Surgical History:  Procedure Laterality Date   LYMPH NODE DISSECTION Left    removal of lymph node in the groin area   WISDOM TOOTH EXTRACTION     March 2018 - 3 teeth extracted    Family History  Problem Relation Age of Onset   Fibroids Mother    Hypertension Father    Heart  disease Father        palpitations   Allergies Father    Cancer Sister        Thyroid Cancer   Breast cancer Maternal Grandmother    Hypertension Maternal Grandfather    Diabetes Maternal Grandfather    Heart attack Maternal Grandfather    Diabetes Paternal Grandfather    Colon cancer Neg Hx    Gastric cancer Neg Hx    Esophageal cancer Neg Hx     Social History   Socioeconomic History   Marital status: Single    Spouse name: Not on file   Number of children: Not on file   Years of education: Not on file   Highest education level: Not on file  Occupational History   Not on file  Tobacco Use   Smoking status: Never   Smokeless tobacco: Never  Substance and Sexual Activity   Alcohol use: No   Drug use: No   Sexual activity: Yes    Birth control/protection: None  Other Topics Concern   Not on file  Social History Narrative   Not on file   Social Determinants of Health   Financial Resource Strain: Not on file  Food Insecurity: No Food Insecurity   Worried About Running Out of Food in the Last Year: Never true   Ran Out of Food in the Last Year: Never true  Transportation Needs:  No Transportation Needs   Lack of Transportation (Medical): No   Lack of Transportation (Non-Medical): No  Physical Activity: Not on file  Stress: Not on file  Social Connections: Not on file  Intimate Partner Violence: Not on file    ROS Review of Systems  Constitutional: Negative.   HENT: Negative.    Eyes: Negative.   Respiratory: Negative.    Cardiovascular: Negative.   Gastrointestinal:  Positive for abdominal pain.  Endocrine: Negative.   Genitourinary: Negative.   Musculoskeletal: Negative.   Skin: Negative.        Psoriasis to scalp   Neurological: Negative.   Hematological: Negative.   Psychiatric/Behavioral: Negative.     Objective:   Today's Vitals: BP 124/87 (BP Location: Left Arm, Patient Position: Sitting, Cuff Size: Normal)   Pulse 71   Temp 97.9 F (36.6 C)    Resp 16   Ht _0  (1.702 m)   Wt 105 lb 3.2 oz (47.7 kg)   SpO2 99%   BMI 16.48 kg/m   Physical Exam HENT:     Head: Normocephalic and atraumatic.     Nose: Nose normal.     Mouth/Throat:     Mouth: Mucous membranes are moist.  Eyes:     Extraocular Movements: Extraocular movements intact.     Conjunctiva/sclera: Conjunctivae normal.     Pupils: Pupils are equal, round, and reactive to light.  Cardiovascular:     Rate and Rhythm: Normal rate and regular rhythm.     Pulses: Normal pulses.     Heart sounds: Normal heart sounds.  Pulmonary:     Effort: Pulmonary effort is normal.     Breath sounds: Normal breath sounds.  Abdominal:     General: Abdomen is flat. Bowel sounds are normal.     Palpations: Abdomen is soft.     Tenderness: There is abdominal tenderness (mild with palpation to epigastric area).  Genitourinary:    Comments: Deferred per patient Musculoskeletal:        General: Normal range of motion.     Cervical back: Normal range of motion.  Skin:    General: Skin is warm.       Neurological:     General: No focal deficit present.     Mental Status: He is alert and oriented to person, place, and time. Mental status is at baseline.  Psychiatric:        Mood and Affect: Mood normal.        Behavior: Behavior normal.        Thought Content: Thought content normal.        Judgment: Judgment normal.    Assessment & Plan:    1. Encounter to establish care - Routine labs will be checked - CBC w/Diff; Future - Comp Met (CMET); Future - Lipid panel; Future - HgB A1c; Future - TSH; Future - Urinalysis; Future - Urinalysis - TSH - HgB A1c - Lipid panel - Comp Met (CMET) - CBC w/Diff  2. Abdominal pain, epigastric - Will check Lipase to rule out Pancreatitis, was advised to go to the ED for worsening symptoms. He was encouraged to complete [facility] financial application for Gastroenterology referral, and do go to the ED for worsening symptoms. - Lipase;  Future - Ambulatory referral to Gastroenterology - Lipase  3. History of psoriasis -He will continue on Nizoral shampoo. - ketoconazole (NIZORAL) 2 % shampoo; Apply 1 application to the affected area(s) topically 2 (two) times a week.  Dispense: 120 mL;  Refill: 1  4. Allergy, initial encounter - He will continue on Zyrtec for seasonal allergy. - cetirizine (ZYRTEC ALLERGY) 10 MG tablet; Take 1 tablet (10 mg total) by mouth once daily.  Dispense: 30 tablet; Refill: 2     Follow-up: Return in about 4 weeks (around 06/24/2021), or if symptoms worsen or fail to improve.   Aryan Sparks Jerold Coombe, NP

## 2021-05-27 NOTE — Patient Instructions (Signed)
High-Protein and High-Calorie Diet Eating high-protein and high-calorie foods can help you to gain weight, heal after an injury, and recover after an illness or surgery. The specific amount of daily protein and calories you need depends on: Your body weight. The reason this diet is recommended for you. Generally, a high-protein, high-calorie diet involves: Eating 250-500 extra calories each day. Making sure that you get enough of your daily calories from protein. Ask your health care provider how many of your calories should come from protein. Talk with a health care provider or a dietitian about how much protein and how many calories you need each day. Follow the diet as directed by your health care provider. What are tips for following this plan? Reading food labels Check the nutrition facts label for calories, grams of fat and protein. Items with more than 4 grams of protein are high-protein foods. Preparing meals Add whole milk, half-and-half, or heavy cream to cereal, pudding, soup, or hot cocoa. Add whole milk to instant breakfast drinks. Add peanut butter to oatmeal or smoothies. Add powdered milk to baked goods, smoothies, or milkshakes. Add powdered milk, cream, or butter to mashed potatoes. Add cheese to cooked vegetables. Make whole-milk yogurt parfaits. Top them with granola, fruit, or nuts. Add cottage cheese to fruit. Add avocado, cheese, or both to sandwiches or salads. Add avocado to smoothies. Add meat, poultry, or seafood to rice, pasta, casseroles, salads, and soups. Use mayonnaise when making egg salad, chicken salad, or tuna salad. Use peanut butter as a dip for fruits and vegetables or as a topping for pretzels, celery, or crackers. Add beans to casseroles, dips, and spreads. Add pureed beans to sauces and soups. Replace calorie-free drinks with calorie-containing drinks, such as milk and fruit juice. Replace water with milk or heavy cream when making foods such as  oatmeal, pudding, or cocoa. Add oil or butter to cooked vegetables and grains. Add cream cheese to sandwiches or as a topping on crackers and bread. Make cream-based pastas and soups. General information Ask your health care provider if you should take a nutritional supplement. Try to eat six small meals each day instead of three large meals. A general goal is to eat every 2 to 3 hours. Eat a balanced diet. In each meal, include one food that is high in protein and one food with fat in it. Keep nutritious snacks available, such as nuts, trail mixes, dried fruit, and yogurt. If you have kidney disease or diabetes, talk with your health care provider about how much protein is safe for you. Too much protein may put extra stress on your kidneys. Drink your calories. Choose high-calorie drinks and have them after your meals. Consider setting a timer to remind you to eat. You will want to eat even if you do not feel very hungry. What high-protein foods should I eat? Vegetables Soybeans. Peas. Grains Quinoa. Bulgur wheat. Buckwheat. Meats and other proteins Beef, pork, and poultry. Fish and seafood. Eggs. Tofu. Textured vegetable protein (TVP). Peanut butter. Nuts and seeds. Dried beans. Protein powders. Hummus. Dairy Whole milk. Whole-milk yogurt. Powdered milk. Cheese. Cottage Cheese. Eggnog. Beverages High-protein supplement drinks. Soy milk. Other foods Protein bars. The items listed above may not be a complete list of foods and beverages you can eat and drink. Contact a dietitian for more information. What high-calorie foods should I eat? Fruits Dried fruit. Fruit leather. Canned fruit in syrup. Fruit juice. Avocado. Vegetables Vegetables cooked in oil or butter. Fried potatoes. Grains Pasta. Quick   breads. Muffins. Pancakes. Ready-to-eat cereal. Meats and other proteins Peanut butter. Nuts and seeds. Dairy Heavy cream. Whipped cream. Cream cheese. Sour cream. Ice cream. Custard.  Pudding. Whole milk dairy products. Beverages Meal-replacement beverages. Nutrition shakes. Fruit juice. Seasonings and condiments Salad dressing. Mayonnaise. Alfredo sauce. Fruit preserves or jelly. Honey. Syrup. Sweets and desserts Cake. Cookies. Pie. Pastries. Candy bars. Chocolate. Fats and oils Butter or margarine. Oil. Gravy. Other foods Meal-replacement bars. The items listed above may not be a complete list of foods and beverages you can eat and drink. Contact a dietitian for more information. Summary A high-protein, high-calorie diet can help you gain weight or heal faster after an injury, illness, or surgery. To increase your protein and calories, add ingredients such as whole milk, peanut butter, cheese, beans, meat, or seafood to meal items. To get enough extra calories each day, include high-calorie foods and beverages at each meal. Adding a high-calorie drink or shake can be an easy way to help you get enough calories each day. Talk with your healthcare provider or dietitian about the best options for you. This information is not intended to replace advice given to you by your health care provider. Make sure you discuss any questions you have with your health care provider. Document Revised: 07/19/2020 Document Reviewed: 07/19/2020 Elsevier Patient Education  2022 [employer] Inc[employer]  

## 2021-05-28 ENCOUNTER — Encounter: Payer: Self-pay | Admitting: *Deleted

## 2021-05-28 ENCOUNTER — Other Ambulatory Visit: Payer: Self-pay

## 2021-05-28 LAB — COMPREHENSIVE METABOLIC PANEL
ALT: 12 IU/L (ref 0–44)
AST: 18 IU/L (ref 0–40)
Albumin/Globulin Ratio: 1.7 (ref 1.2–2.2)
Albumin: 4.8 g/dL (ref 4.0–5.0)
Alkaline Phosphatase: 93 IU/L (ref 44–121)
BUN/Creatinine Ratio: 6 — ABNORMAL LOW (ref 9–20)
BUN: 6 mg/dL (ref 6–20)
Bilirubin Total: 0.6 mg/dL (ref 0.0–1.2)
CO2: 26 mmol/L (ref 20–29)
Calcium: 9.7 mg/dL (ref 8.7–10.2)
Chloride: 100 mmol/L (ref 96–106)
Creatinine, Ser: 1.04 mg/dL (ref 0.76–1.27)
Globulin, Total: 2.9 g/dL (ref 1.5–4.5)
Glucose: 94 mg/dL (ref 70–99)
Potassium: 4 mmol/L (ref 3.5–5.2)
Sodium: 139 mmol/L (ref 134–144)
Total Protein: 7.7 g/dL (ref 6.0–8.5)
eGFR: 97 mL/min/{1.73_m2} (ref >59)

## 2021-05-28 LAB — LIPID PANEL
Chol/HDL Ratio: 3.1 ratio (ref 0.0–5.0)
Cholesterol, Total: 162 mg/dL (ref 100–199)
HDL: 52 mg/dL (ref >39)
LDL Chol Calc (NIH): 101 mg/dL — ABNORMAL HIGH (ref 0–99)
Triglycerides: 44 mg/dL (ref 0–149)
VLDL Cholesterol Cal: 9 mg/dL (ref 5–40)

## 2021-05-28 LAB — URINALYSIS
Bilirubin, UA: NEGATIVE
Glucose, UA: NEGATIVE
Ketones, UA: NEGATIVE
Leukocytes,UA: NEGATIVE
Nitrite, UA: NEGATIVE
Protein,UA: NEGATIVE
RBC, UA: NEGATIVE
Specific Gravity, UA: 1.022 (ref 1.005–1.030)
Urobilinogen, Ur: 0.2 mg/dL (ref 0.2–1.0)
pH, UA: 8 — ABNORMAL HIGH (ref 5.0–7.5)

## 2021-05-28 LAB — CBC WITH DIFFERENTIAL/PLATELET
Basophils Absolute: 0.2 10*3/uL (ref 0.0–0.2)
Basos: 2 %
EOS (ABSOLUTE): 1.3 10*3/uL — ABNORMAL HIGH (ref 0.0–0.4)
Eos: 13 %
Hematocrit: 44 % (ref 37.5–51.0)
Hemoglobin: 15.2 g/dL (ref 13.0–17.7)
Immature Grans (Abs): 0 10*3/uL (ref 0.0–0.1)
Immature Granulocytes: 0 %
Lymphocytes Absolute: 2.4 10*3/uL (ref 0.7–3.1)
Lymphs: 24 %
MCH: 28.5 pg (ref 26.6–33.0)
MCHC: 34.5 g/dL (ref 31.5–35.7)
MCV: 82 fL (ref 79–97)
Monocytes Absolute: 0.7 10*3/uL (ref 0.1–0.9)
Monocytes: 7 %
Neutrophils Absolute: 5.5 10*3/uL (ref 1.4–7.0)
Neutrophils: 54 %
Platelets: 302 10*3/uL (ref 150–450)
RBC: 5.34 x10E6/uL (ref 4.14–5.80)
RDW: 13 % (ref 11.6–15.4)
WBC: 10 10*3/uL (ref 3.4–10.8)

## 2021-05-28 LAB — TSH: TSH: 1.53 u[IU]/mL (ref 0.450–4.500)

## 2021-05-28 LAB — SPECIMEN STATUS REPORT

## 2021-05-28 LAB — HEMOGLOBIN A1C
Est. average glucose Bld gHb Est-mCnc: 111 mg/dL
Hgb A1c MFr Bld: 5.5 % (ref 4.8–5.6)

## 2021-05-28 LAB — LIPASE: Lipase: 69 U/L (ref 13–78)

## 2021-05-31 ENCOUNTER — Other Ambulatory Visit: Payer: Self-pay

## 2021-06-04 ENCOUNTER — Ambulatory Visit: Payer: Medicaid - Out of State | Admitting: Pharmacy Technician

## 2021-06-04 DIAGNOSIS — Z79899 Other long term (current) drug therapy: Secondary | ICD-10-CM

## 2021-06-23 ENCOUNTER — Other Ambulatory Visit: Payer: Self-pay

## 2021-06-23 NOTE — Progress Notes (Signed)
Completed [facility] application and contract.  Patient agreed to all terms of the [facility] contract.    Patient approved to receive medication assistance at [facility] until time for re-certification in 2023, and as long as eligibility criteria continues to be met.    Provided patient with community resource material based on his particular needs.    Ithiel Liebler J. Netanya Yazdani Care Manager [facility]  

## 2021-06-24 ENCOUNTER — Ambulatory Visit: Payer: Medicaid Other | Admitting: Internal Medicine

## 2021-06-24 ENCOUNTER — Other Ambulatory Visit: Payer: Self-pay

## 2021-06-29 ENCOUNTER — Ambulatory Visit: Payer: Medicaid Other | Admitting: Gerontology

## 2021-06-29 ENCOUNTER — Other Ambulatory Visit: Payer: Self-pay

## 2021-06-29 ENCOUNTER — Encounter: Payer: Self-pay | Admitting: Gerontology

## 2021-06-29 VITALS — BP 122/81 | HR 96 | Temp 98.2°F | Resp 16 | Ht 68.0 in | Wt 109.6 lb

## 2021-06-29 DIAGNOSIS — R42 Dizziness and giddiness: Secondary | ICD-10-CM

## 2021-06-29 DIAGNOSIS — H6690 Otitis media, unspecified, unspecified ear: Secondary | ICD-10-CM | POA: Insufficient documentation

## 2021-06-29 DIAGNOSIS — E119 Type 2 diabetes mellitus without complications: Secondary | ICD-10-CM

## 2021-06-29 MED ORDER — CEFDINIR 300 MG PO CAPS
300.0000 mg | ORAL_CAPSULE | Freq: Two times a day (BID) | ORAL | 0 refills | Status: DC
  Filled 2021-06-29: qty 10, 5d supply, fill #0

## 2021-06-29 MED ORDER — AZITHROMYCIN 250 MG PO TABS
ORAL_TABLET | ORAL | 0 refills | Status: DC
  Filled 2021-06-29: qty 6, fill #0

## 2021-06-29 NOTE — Progress Notes (Signed)
Established Patient Office Visit  Subjective:  Patient ID: Zachary Hartman, male    DOB: [birth year]  Age: 35 y.o. MRN: [id number]  CC:  Chief Complaint  Patient presents with   Follow-up    Labs drawn 05/27/21   Dizziness    Patient c/o dizziness, feeling off balance and spinning sensation x 5-6 days. Patient has been using generic Benadryl allergy plus congestion, which he states has helped.    HPI Zachary Hartman  is a 35 y/o male who has history of Pancreatitis, GERD, Migraine presents for lab review. His lab done on 05/27/21 were unremarkable. He reports that his epigastric abdominal pain has resolved. Currently, he c/o dizziness that started 6 days ago after waking up, stating that his balance is off and intermittent vertigo that resolves after 3 minutes. He states that dizziness is intermittent, but had 2 episodes prior to his appointment. He reports taking otc Allergy relief  for the past 2 days and his dizzy episodes has decreased to 2 times daily. He states that he has being experiencing distorted hearing with high pitched sounds to his right ear that has being going on for 5 years. He also reports tinnitus, nasal congestion, rhinnorea and sinus pressure. Overall, he states that he's concerned with his ear and offers no further complaint.  Past Medical History:  Diagnosis Date   Acute pancreatitis 08/30/2011   Allergy    GERD (gastroesophageal reflux disease)    Migraines    Psoriasis of scalp     Past Surgical History:  Procedure Laterality Date   LYMPH NODE DISSECTION Left    removal of lymph node in the groin area   WISDOM TOOTH EXTRACTION     March 2018 - 3 teeth extracted    Family History  Problem Relation Age of Onset   Fibroids Mother    Hypertension Father    Heart disease Father        palpitations   Allergies Father    Cancer Sister        Thyroid Cancer   Breast cancer Maternal Grandmother    Hypertension Maternal Grandfather    Diabetes  Maternal Grandfather    Heart attack Maternal Grandfather    Diabetes Paternal Grandfather    Colon cancer Neg Hx    Gastric cancer Neg Hx    Esophageal cancer Neg Hx     Social History   Socioeconomic History   Marital status: Single    Spouse name: Not on file   Number of children: Not on file   Years of education: Not on file   Highest education level: Not on file  Occupational History   Not on file  Tobacco Use   Smoking status: Never   Smokeless tobacco: Never  Substance and Sexual Activity   Alcohol use: No   Drug use: No   Sexual activity: Yes    Birth control/protection: None  Other Topics Concern   Not on file  Social History Narrative   Not on file   Social Determinants of Health   Financial Resource Strain: Not on file  Food Insecurity: No Food Insecurity   Worried About Running Out of Food in the Last Year: Never true   Ran Out of Food in the Last Year: Never true  Transportation Needs: No Transportation Needs   Lack of Transportation (Medical): No   Lack of Transportation (Non-Medical): No  Physical Activity: Not on file  Stress: Not on file  Social Connections: Not on  file  Intimate Partner Violence: Not on file    Outpatient Medications Prior to Visit  Medication Sig Dispense Refill   ketoconazole (NIZORAL) 2 % shampoo Apply 1 application to the affected area topically 2 (two) times a week. 120 mL 1   aspirin 325 MG tablet Take 1 tablet by mouth daily as needed. (Patient not taking: Reported on 06/29/2021)     cetirizine (ZYRTEC ALLERGY) 10 MG tablet Take 1 tablet (10 mg total) by mouth once daily. (Patient not taking: Reported on 06/29/2021) 30 tablet 2   No facility-administered medications prior to visit.    Allergies  Allergen Reactions   Ibuprofen Other (See Comments)    "groggy and woozy"   Morphine And Related Other (See Comments)    shaky   Tylenol [Acetaminophen] Other (See Comments)    Confusion and disorientation   Penicillins  Hives    ROS Review of Systems  Constitutional: Negative.   HENT:  Positive for tinnitus. Negative for congestion, ear discharge, ear pain, hearing loss, postnasal drip, rhinorrhea, sinus pressure and sinus pain.   Respiratory: Negative.    Cardiovascular: Negative.   Neurological:  Positive for dizziness.     Objective:    Physical Exam HENT:     Head: Normocephalic and atraumatic.     Right Ear: No drainage. A middle ear effusion is present. There is no impacted cerumen. No mastoid tenderness.     Left Ear: No drainage. A middle ear effusion is present. There is no impacted cerumen. No mastoid tenderness.  Cardiovascular:     Rate and Rhythm: Normal rate and regular rhythm.     Pulses: Normal pulses.     Heart sounds: Normal heart sounds.  Pulmonary:     Effort: Pulmonary effort is normal.     Breath sounds: Normal breath sounds.  Neurological:     General: No focal deficit present.     Mental Status: He is alert and oriented to person, place, and time. Mental status is at baseline.    BP 122/81 (BP Location: Right Arm, Patient Position: Sitting, Cuff Size: Normal)   Pulse 96   Temp 98.2 F (36.8 C)   Resp 16   Ht '5\' 8"'  (1.727 m)   Wt 109 lb 9.6 oz (49.7 kg)   SpO2 98%   BMI 16.66 kg/m  Wt Readings from Last 3 Encounters:  06/29/21 109 lb 9.6 oz (49.7 kg)  05/27/21 105 lb 3.2 oz (47.7 kg)  01/02/18 103 lb (46.7 kg)     Health Maintenance Due  Topic Date Due   Pneumococcal Vaccine 28-28 Years old (1 - PCV) Never done   URINE MICROALBUMIN  Never done   Hepatitis C Screening  Never done   TETANUS/TDAP  Never done   INFLUENZA VACCINE  Never done    There are no preventive care reminders to display for this patient.  Lab Results  Component Value Date   TSH 1.530 05/27/2021   Lab Results  Component Value Date   WBC 10.0 05/27/2021   HGB 15.2 05/27/2021   HCT 44.0 05/27/2021   MCV 82 05/27/2021   PLT 302 05/27/2021   Lab Results  Component Value Date    NA 139 05/27/2021   K 4.0 05/27/2021   CO2 26 05/27/2021   GLUCOSE 94 05/27/2021   BUN 6 05/27/2021   CREATININE 1.04 05/27/2021   BILITOT 0.6 05/27/2021   ALKPHOS 93 05/27/2021   AST 18 05/27/2021   ALT 12 05/27/2021  PROT 7.7 05/27/2021   ALBUMIN 4.8 05/27/2021   CALCIUM 9.7 05/27/2021   ANIONGAP 6 06/02/2017   EGFR 97 05/27/2021   Lab Results  Component Value Date   CHOL 162 05/27/2021   Lab Results  Component Value Date   HDL 52 05/27/2021   Lab Results  Component Value Date   LDLCALC 101 (H) 05/27/2021   Lab Results  Component Value Date   TRIG 44 05/27/2021   Lab Results  Component Value Date   CHOLHDL 3.1 05/27/2021   Lab Results  Component Value Date   HGBA1C 5.5 05/27/2021      Assessment & Plan:    1. Acute otitis media, unspecified otitis media type -He has redness and opacified TM to bilateral ear. He was started on Cefdinir, educated on medication side effects and advised to go to the ED. - cefdinir (OMNICEF) 300 MG capsule; Take 1 capsule (300 mg total) by mouth 2 (two) times daily.  Dispense: 10 capsule; Refill: 0  2. Dizziness - He was encouraged to increase fluid intake, change position slowly. He was advised to go to the ED for worsening symptoms.     Follow-up: Return in about 3 weeks (around 07/20/2021), or if symptoms worsen or fail to improve.    Dysen Edmondson Jerold Coombe, NP

## 2021-06-29 NOTE — Patient Instructions (Signed)

## 2021-07-20 ENCOUNTER — Ambulatory Visit: Payer: Medicaid Other | Admitting: Gerontology

## 2021-07-21 ENCOUNTER — Ambulatory Visit: Payer: Medicaid Other | Admitting: Gerontology

## 2021-08-12 ENCOUNTER — Ambulatory Visit: Payer: Medicaid Other | Admitting: Gerontology

## 2021-08-12 ENCOUNTER — Encounter: Payer: Self-pay | Admitting: Gerontology

## 2021-08-12 ENCOUNTER — Other Ambulatory Visit: Payer: Self-pay

## 2021-08-12 VITALS — BP 117/77 | HR 80 | Temp 97.5°F | Resp 16 | Ht 69.0 in | Wt 107.6 lb

## 2021-08-12 DIAGNOSIS — Z872 Personal history of diseases of the skin and subcutaneous tissue: Secondary | ICD-10-CM

## 2021-08-12 DIAGNOSIS — T7840XA Allergy, unspecified, initial encounter: Secondary | ICD-10-CM

## 2021-08-12 MED ORDER — KETOCONAZOLE 2 % EX SHAM
1.0000 "application " | MEDICATED_SHAMPOO | CUTANEOUS | 1 refills | Status: DC
  Filled 2021-08-12: qty 120, 30d supply, fill #0

## 2021-08-12 MED ORDER — CETIRIZINE HCL 10 MG PO TABS
10.0000 mg | ORAL_TABLET | Freq: Every day | ORAL | 2 refills | Status: DC
  Filled 2021-08-12: qty 30, 30d supply, fill #0

## 2021-08-12 NOTE — Progress Notes (Signed)
Established Patient Office Visit  Subjective:  Patient ID: Zachary Hartman, male    DOB: [birth year]  Age: 35 y.o. MRN: [id number]  CC:  Chief Complaint  Patient presents with   Follow-up    HPI Zachary Hartman  is a 35 y/o male who has history of Pancreatitis, GERD, Migraine presents for follow up appointment and medication refill. During his last visit, he was treated for Acute otitis media and dizziness with Cefdinir. Currently, he states that his symptoms has resolved. Overall, he states that he's doing well and offers no further complaint.  Past Medical History:  Diagnosis Date   Acute pancreatitis 08/30/2011   Allergy    GERD (gastroesophageal reflux disease)    Migraines    Psoriasis of scalp     Past Surgical History:  Procedure Laterality Date   LYMPH NODE DISSECTION Left    removal of lymph node in the groin area   WISDOM TOOTH EXTRACTION     March 2018 - 3 teeth extracted    Family History  Problem Relation Age of Onset   Fibroids Mother    Hypertension Father    Heart disease Father        palpitations   Allergies Father    Cancer Sister        Thyroid Cancer   Breast cancer Maternal Grandmother    Hypertension Maternal Grandfather    Diabetes Maternal Grandfather    Heart attack Maternal Grandfather    Diabetes Paternal Grandfather    Colon cancer Neg Hx    Gastric cancer Neg Hx    Esophageal cancer Neg Hx     Social History   Socioeconomic History   Marital status: Single    Spouse name: Not on file   Number of children: Not on file   Years of education: Not on file   Highest education level: Not on file  Occupational History   Not on file  Tobacco Use   Smoking status: Never   Smokeless tobacco: Never  Vaping Use   Vaping Use: Never used  Substance and Sexual Activity   Alcohol use: No   Drug use: No   Sexual activity: Yes    Birth control/protection: None  Other Topics Concern   Not on file  Social History Narrative    Not on file   Social Determinants of Health   Financial Resource Strain: Not on file  Food Insecurity: No Food Insecurity   Worried About Running Out of Food in the Last Year: Never true   Ran Out of Food in the Last Year: Never true  Transportation Needs: No Transportation Needs   Lack of Transportation (Medical): No   Lack of Transportation (Non-Medical): No  Physical Activity: Not on file  Stress: Not on file  Social Connections: Not on file  Intimate Partner Violence: Not on file    Outpatient Medications Prior to Visit  Medication Sig Dispense Refill   aspirin 325 MG tablet Take 1 tablet by mouth daily as needed.     cetirizine (ZYRTEC ALLERGY) 10 MG tablet Take 1 tablet (10 mg total) by mouth once daily. 30 tablet 2   ketoconazole (NIZORAL) 2 % shampoo Apply 1 application to the affected area topically 2 (two) times a week. 120 mL 1   cefdinir (OMNICEF) 300 MG capsule Take 1 capsule (300 mg total) by mouth 2 (two) times daily. 10 capsule 0   No facility-administered medications prior to visit.    Allergies  Allergen  Reactions   Ibuprofen Other (See Comments)    "groggy and woozy"   Morphine And Related Other (See Comments)    shaky   Tylenol [Acetaminophen] Other (See Comments)    Confusion and disorientation   Penicillins Hives    ROS Review of Systems  Constitutional: Negative.   Eyes: Negative.   Respiratory: Negative.    Cardiovascular: Negative.   Neurological: Negative.   Psychiatric/Behavioral: Negative.       Objective:    Physical Exam HENT:     Head: Normocephalic and atraumatic.     Right Ear: Tympanic membrane, ear canal and external ear normal. There is no impacted cerumen.     Left Ear: Tympanic membrane, ear canal and external ear normal. There is no impacted cerumen.     Mouth/Throat:     Mouth: Mucous membranes are moist.  Cardiovascular:     Rate and Rhythm: Normal rate and regular rhythm.     Pulses: Normal pulses.     Heart sounds:  Normal heart sounds.  Pulmonary:     Effort: Pulmonary effort is normal.     Breath sounds: Normal breath sounds.  Neurological:     General: No focal deficit present.     Mental Status: He is alert and oriented to person, place, and time. Mental status is at baseline.  Psychiatric:        Mood and Affect: Mood normal.        Behavior: Behavior normal.        Thought Content: Thought content normal.        Judgment: Judgment normal.    BP 117/77 (BP Location: Right Arm, Patient Position: Sitting, Cuff Size: Normal)    Pulse 80    Temp (!) 97.5 F (36.4 C) (Oral)    Resp 16    Ht '5\' 9"'  (1.753 m)    Wt 107 lb 9.6 oz (48.8 kg)    SpO2 97%    BMI 15.89 kg/m  Wt Readings from Last 3 Encounters:  08/12/21 107 lb 9.6 oz (48.8 kg)  06/29/21 109 lb 9.6 oz (49.7 kg)  05/27/21 105 lb 3.2 oz (47.7 kg)     Health Maintenance Due  Topic Date Due   URINE MICROALBUMIN  Never done   Hepatitis C Screening  Never done   TETANUS/TDAP  Never done    There are no preventive care reminders to display for this patient.  Lab Results  Component Value Date   TSH 1.530 05/27/2021   Lab Results  Component Value Date   WBC 10.0 05/27/2021   HGB 15.2 05/27/2021   HCT 44.0 05/27/2021   MCV 82 05/27/2021   PLT 302 05/27/2021   Lab Results  Component Value Date   NA 139 05/27/2021   K 4.0 05/27/2021   CO2 26 05/27/2021   GLUCOSE 94 05/27/2021   BUN 6 05/27/2021   CREATININE 1.04 05/27/2021   BILITOT 0.6 05/27/2021   ALKPHOS 93 05/27/2021   AST 18 05/27/2021   ALT 12 05/27/2021   PROT 7.7 05/27/2021   ALBUMIN 4.8 05/27/2021   CALCIUM 9.7 05/27/2021   ANIONGAP 6 06/02/2017   EGFR 97 05/27/2021   Lab Results  Component Value Date   CHOL 162 05/27/2021   Lab Results  Component Value Date   HDL 52 05/27/2021   Lab Results  Component Value Date   LDLCALC 101 (H) 05/27/2021   Lab Results  Component Value Date   TRIG 44 05/27/2021   Lab  Results  Component Value Date   CHOLHDL  3.1 05/27/2021   Lab Results  Component Value Date   HGBA1C 5.5 05/27/2021      Assessment & Plan:    1. Allergy, initial encounter - His allergy is under control and he will continue on current medication. - cetirizine (ZYRTEC ALLERGY) 10 MG tablet; Take 1 tablet (10 mg total) by mouth once daily.  Dispense: 30 tablet; Refill: 2  2. History of psoriasis - He will continue on current medication. - ketoconazole (NIZORAL) 2 % shampoo; Apply 1 application to the affected area(s) topically 2 (two) times a week.  Dispense: 120 mL; Refill: 1    Follow-up: Return in about 6 months (around 02/10/2022), or if symptoms worsen or fail to improve.    Fiorela Pelzer Jerold Coombe, NP

## 2021-08-26 ENCOUNTER — Other Ambulatory Visit: Payer: Self-pay

## 2021-11-03 ENCOUNTER — Other Ambulatory Visit: Payer: Self-pay

## 2021-11-03 ENCOUNTER — Ambulatory Visit: Payer: Medicaid - Out of State | Admitting: Pharmacy Technician

## 2021-11-03 DIAGNOSIS — Z79899 Other long term (current) drug therapy: Secondary | ICD-10-CM

## 2021-11-03 MED ORDER — DICYCLOMINE HCL 10 MG PO CAPS
10.0000 mg | ORAL_CAPSULE | Freq: Four times a day (QID) | ORAL | 0 refills | Status: DC | PRN
  Filled 2021-11-03: qty 40, 10d supply, fill #0

## 2021-11-03 MED ORDER — ONDANSETRON 4 MG PO TBDP
4.0000 mg | ORAL_TABLET | ORAL | 0 refills | Status: AC | PRN
  Filled 2021-11-03: qty 12, 2d supply, fill #0

## 2021-11-04 ENCOUNTER — Other Ambulatory Visit: Payer: Self-pay

## 2021-11-04 NOTE — Progress Notes (Signed)
Patient has Medicaid with prescription drug coverage in [place].  Patient stated that he used his girlfriend's address in [place], [place] to obtain Medicaid.  Patient stated that he stays part of the the time in [place], [place] and part of the time in [place].   ? ?Patient recently had an ED visit at [facility].  Prescriptions transferred from [place], [place], [place] to [facility].  Prescriptions transferred back to [place], [place] per pharmacist and Pharmacy Director.  Patient notified that he would need to pick-up prescriptions at [facility] in [place], [place]. ? ?Jacquelynn Cree ?Care Manager ?[facility] ?

## 2021-11-23 ENCOUNTER — Other Ambulatory Visit: Payer: Self-pay

## 2021-11-25 ENCOUNTER — Telehealth: Payer: Self-pay | Admitting: Pharmacy Technician

## 2021-11-25 NOTE — Telephone Encounter (Signed)
Patient called stating that he wanted to refill medications.  Asked patient if he was living in [place].  Patient stated that he is living on [address], [place].  However, he stated that he cannot provide any documentation with that address.  All of his mail and documentation have a [place] address.  Explained to patient that we need to verify that he is an [facility] resident before we can provide medication assistance.  Also, patient continues to have prescriptions sent to [facility] from providers in [place], [place].  Made patient aware that he can use his Medicaid for his prescriptions. ? ?Sherilyn Dacosta ?Care Manager ?[facility] ?

## 2021-11-26 ENCOUNTER — Telehealth: Payer: Self-pay | Admitting: Pharmacy Technician

## 2021-11-26 NOTE — Telephone Encounter (Signed)
Patient e-mailed documentation stating that Medicaid closed 12/17/19.  However, another Medicaid plan with [employer] [place] was opened on by patient on 10/27/20 which is still active.  Called [employer] and spoke with Shevon P.  Shevon stated that patient has active medical and prescription drug coverage with [facility].  Patient stated that he is going to close plan.  Explained that [facility] would need a termination letter along with verification that he is living in [facility] before medication assistance could be provided.  Patient verbally acknowledged that he understood. ? ?Sherilyn Dacosta ?Care Manager ?[facility] ?

## 2021-11-30 ENCOUNTER — Other Ambulatory Visit: Payer: Self-pay

## 2021-12-30 ENCOUNTER — Other Ambulatory Visit: Payer: Self-pay

## 2022-01-07 ENCOUNTER — Other Ambulatory Visit: Payer: Self-pay

## 2022-01-08 ENCOUNTER — Other Ambulatory Visit: Payer: Self-pay | Admitting: Gerontology

## 2022-01-08 DIAGNOSIS — Z872 Personal history of diseases of the skin and subcutaneous tissue: Secondary | ICD-10-CM

## 2022-02-10 ENCOUNTER — Ambulatory Visit: Payer: Medicaid Other | Admitting: Gerontology

## 2022-03-31 DIAGNOSIS — Z111 Encounter for screening for respiratory tuberculosis: Secondary | ICD-10-CM | POA: Diagnosis not present

## 2022-04-02 DIAGNOSIS — Z111 Encounter for screening for respiratory tuberculosis: Secondary | ICD-10-CM | POA: Diagnosis not present

## 2022-04-02 DIAGNOSIS — Z681 Body mass index (BMI) 19 or less, adult: Secondary | ICD-10-CM | POA: Diagnosis not present

## 2022-05-10 ENCOUNTER — Ambulatory Visit (INDEPENDENT_AMBULATORY_CARE_PROVIDER_SITE_OTHER): Payer: 59 | Admitting: Internal Medicine

## 2022-05-10 ENCOUNTER — Telehealth: Payer: Self-pay | Admitting: Internal Medicine

## 2022-05-10 ENCOUNTER — Encounter: Payer: Self-pay | Admitting: Internal Medicine

## 2022-05-10 VITALS — BP 119/80 | HR 82 | Temp 98.5°F | Resp 16 | Ht 68.0 in | Wt 103.4 lb

## 2022-05-10 DIAGNOSIS — J3089 Other allergic rhinitis: Secondary | ICD-10-CM | POA: Diagnosis not present

## 2022-05-10 DIAGNOSIS — B350 Tinea barbae and tinea capitis: Secondary | ICD-10-CM

## 2022-05-10 DIAGNOSIS — R519 Headache, unspecified: Secondary | ICD-10-CM | POA: Diagnosis not present

## 2022-05-10 DIAGNOSIS — R634 Abnormal weight loss: Secondary | ICD-10-CM

## 2022-05-10 MED ORDER — CETIRIZINE HCL 10 MG PO TABS: 10.0000 mg | ORAL_TABLET | Freq: Every day | ORAL | 11 refills | Status: DC

## 2022-05-10 MED ORDER — MONTELUKAST SODIUM 10 MG PO TABS: 10.0000 mg | ORAL_TABLET | Freq: Every day | ORAL | 3 refills | Status: AC

## 2022-05-10 MED ORDER — KETOCONAZOLE 2 % EX SHAM: MEDICATED_SHAMPOO | CUTANEOUS | 3 refills | Status: DC

## 2022-05-10 MED ORDER — FLUTICASONE PROPIONATE 50 MCG/ACT NA SUSP: 2.0000 | Freq: Every day | NASAL | 6 refills | Status: AC

## 2022-05-10 NOTE — Telephone Encounter (Signed)
done

## 2022-05-10 NOTE — Progress Notes (Signed)
[facility] [address] [place], [place] [address]  Internal MEDICINE  Office Visit Note  Patient Name: Zachary Hartman  [id number]  [id number]  Date of Service: 05/10/2022   Complaints/HPI Pt is here for establishment of PCP. Chief Complaint  Patient presents with   New Patient (Initial Visit)   Ear Problem    Has had trouble with right ear for a few years now, has gotten ear infections/vertigo often due to this   HPI Patient is here for establishment of PCP has few complaints Has been having problem with his right ear, feels full, has nasal congestion and headache as well Patient also take allergy medicine over-the-counter with some relief in his symptoms Gives history of psoriasis questionable like to have ketoconazole shampoo on hand for itching Patient has multiple intolerance to food eats only limited amount is very active has lost some weight her BMI is on the low side Current Medication: Outpatient Encounter Medications as of 05/10/2022  Medication Sig Note   aspirin 325 MG tablet Take 1 tablet by mouth daily as needed. 05/27/2021: Patient taking 1 every 3 hours prn pain   cetirizine (ZYRTEC ALLERGY) 10 MG tablet Take 1 tablet (10 mg total) by mouth once daily.    cetirizine (ZYRTEC) 10 MG tablet Take 1 tablet (10 mg total) by mouth daily.    fluticasone (FLONASE) 50 MCG/ACT nasal spray Place 2 sprays into both nostrils daily.    montelukast (SINGULAIR) 10 MG tablet Take 1 tablet (10 mg total) by mouth at bedtime.    ondansetron (ZOFRAN-ODT) 4 MG disintegrating tablet Dissolve 1 tablet (4 mg total) by mouth once every 4 (four) hours as needed.    [DISCONTINUED] ketoconazole (NIZORAL) 2 % shampoo APPLY 1 APPLICATION TOPICALLY TO THE AFFECTED AREA(S) TWICE A WEEK    ketoconazole (NIZORAL) 2 % shampoo APPLY 1 APPLICATION TOPICALLY TO THE AFFECTED AREA(S) TWICE A WEEK    [DISCONTINUED] dicyclomine (BENTYL) 10 MG capsule Take 1 capsule (10 mg total) by mouth once  every 6 (six) hours as needed.    No facility-administered encounter medications on file as of 05/10/2022.    Surgical History: Past Surgical History:  Procedure Laterality Date   LYMPH NODE DISSECTION Left    removal of lymph node in the groin area   WISDOM TOOTH EXTRACTION     March 2018 - 3 teeth extracted    Medical History: Past Medical History:  Diagnosis Date   Acute pancreatitis 08/30/2011   Allergy    GERD (gastroesophageal reflux disease)    Migraines    Psoriasis of scalp     Family History: Family History  Problem Relation Age of Onset   Fibroids Mother    Hypertension Father    Heart disease Father        palpitations   Allergies Father    Cancer Sister        Thyroid Cancer   Breast cancer Maternal Grandmother    Hypertension Maternal Grandfather    Diabetes Maternal Grandfather    Heart attack Maternal Grandfather    Diabetes Paternal Grandfather    Colon cancer Neg Hx    Gastric cancer Neg Hx    Esophageal cancer Neg Hx     Social History   Socioeconomic History   Marital status: Single    Spouse name: Not on file   Number of children: Not on file   Years of education: Not on file   Highest education level: Not on file  Occupational  History   Not on file  Tobacco Use   Smoking status: Never   Smokeless tobacco: Never  Vaping Use   Vaping Use: Never used  Substance and Sexual Activity   Alcohol use: No   Drug use: No   Sexual activity: Yes    Birth control/protection: None  Other Topics Concern   Not on file  Social History Narrative   Not on file   Social Determinants of Health   Financial Resource Strain: Not on file  Food Insecurity: No Food Insecurity (05/27/2021)   Hunger Vital Sign    Worried About Running Out of Food in the Last Year: Never true    Ran Out of Food in the Last Year: Never true  Transportation Needs: No Transportation Needs (05/27/2021)   PRAPARE - Administrator, Civil Service (Medical): No     Lack of Transportation (Non-Medical): No  Physical Activity: Not on file  Stress: Not on file  Social Connections: Not on file  Intimate Partner Violence: Not on file     Review of Systems  Constitutional:  Positive for unexpected weight change. Negative for chills and fatigue.  HENT:  Positive for postnasal drip. Negative for congestion, rhinorrhea, sneezing and sore throat.   Eyes:  Negative for redness.  Respiratory:  Negative for cough, chest tightness and shortness of breath.   Cardiovascular:  Negative for chest pain and palpitations.  Gastrointestinal:  Negative for abdominal pain, constipation, diarrhea, nausea and vomiting.  Genitourinary:  Negative for dysuria and frequency.  Musculoskeletal:  Negative for arthralgias, back pain, joint swelling and neck pain.  Skin:  Negative for rash.  Neurological: Negative.  Negative for tremors and numbness.  Hematological:  Negative for adenopathy. Does not bruise/bleed easily.  Psychiatric/Behavioral:  Negative for behavioral problems (Depression), sleep disturbance and suicidal ideas. The patient is not nervous/anxious.     Vital Signs: BP 119/80   Pulse 82   Temp 98.5 F (36.9 C)   Resp 16   Ht 5\' 8"  (1.727 m)   Wt 103 lb 6.4 oz (46.9 kg)   SpO2 99%   BMI 15.72 kg/m    Physical Exam Constitutional:      Appearance: Normal appearance.  HENT:     Head: Normocephalic and atraumatic.     Nose: Nose normal.     Mouth/Throat:     Mouth: Mucous membranes are moist.     Pharynx: No posterior oropharyngeal erythema.  Eyes:     Extraocular Movements: Extraocular movements intact.     Pupils: Pupils are equal, round, and reactive to light.  Cardiovascular:     Pulses: Normal pulses.     Heart sounds: Normal heart sounds.  Pulmonary:     Effort: Pulmonary effort is normal.     Breath sounds: Normal breath sounds.  Neurological:     General: No focal deficit present.     Mental Status: He is alert.  Psychiatric:         Mood and Affect: Mood normal.        Behavior: Behavior normal.       Assessment/Plan: 1. Non-seasonal allergic rhinitis due to other allergic trigger With the symptoms we will add Singulair and Flonase continue use Zyrtec as before - montelukast (SINGULAIR) 10 MG tablet; Take 1 tablet (10 mg total) by mouth at bedtime.  Dispense: 30 tablet; Refill: 3 - cetirizine (ZYRTEC) 10 MG tablet; Take 1 tablet (10 mg total) by mouth daily.  Dispense: 30 tablet; Refill:  11 - fluticasone (FLONASE) 50 MCG/ACT nasal spray; Place 2 sprays into both nostrils daily.  Dispense: 16 g; Refill: 6  2. Fungal scalp infection Patient has history of previous psoriasis we will refill Nizoral shampoo - ketoconazole (NIZORAL) 2 % shampoo; APPLY 1 APPLICATION TOPICALLY TO THE AFFECTED AREA(S) TWICE A WEEK  Dispense: 120 mL; Refill: 3  3. Sinus headache Patient instructed to use Flonase questionable migraine headache we will reevaluate might be a good candidate for other therapy  4. 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 I have discussed any further diagnostic evaluation that may be needed or ordered today. We also reviewed his medications today. he has been encouraged to call the office with any questions or concerns that should arise related to todays visit.    Counseling:  [place] Controlled Substance Database was reviewed by me.    Meds ordered this encounter  Medications   montelukast (SINGULAIR) 10 MG tablet    Sig: Take 1 tablet (10 mg total) by mouth at bedtime.    Dispense:  30 tablet    Refill:  3   cetirizine (ZYRTEC) 10 MG tablet    Sig: Take 1 tablet (10 mg total) by mouth daily.    Dispense:  30 tablet    Refill:   11   fluticasone (FLONASE) 50 MCG/ACT nasal spray    Sig: Place 2 sprays into both nostrils daily.    Dispense:  16 g    Refill:  6   ketoconazole (NIZORAL) 2 % shampoo    Sig: APPLY 1 APPLICATION TOPICALLY TO THE AFFECTED AREA(S) TWICE A WEEK    Dispense:  120 mL    Refill:  3    Time spent:30 Minutes

## 2022-08-08 ENCOUNTER — Ambulatory Visit: Payer: 59 | Admitting: Nurse Practitioner

## 2022-09-06 ENCOUNTER — Telehealth: Payer: Self-pay | Admitting: Internal Medicine

## 2022-09-06 NOTE — Telephone Encounter (Signed)
Left vm and sent mychart message to confirm 09/12/22 appointment-Toni 

## 2022-09-12 ENCOUNTER — Encounter: Payer: BLUE CROSS/BLUE SHIELD | Admitting: Physician Assistant

## 2022-09-19 ENCOUNTER — Other Ambulatory Visit: Payer: Self-pay

## 2022-09-19 DIAGNOSIS — J3089 Other allergic rhinitis: Secondary | ICD-10-CM

## 2022-09-19 MED ORDER — CETIRIZINE HCL 10 MG PO TABS: 10.0000 mg | ORAL_TABLET | Freq: Every day | ORAL | 1 refills | Status: DC

## 2022-09-29 ENCOUNTER — Ambulatory Visit (INDEPENDENT_AMBULATORY_CARE_PROVIDER_SITE_OTHER): Payer: 59 | Admitting: Physician Assistant

## 2022-09-29 ENCOUNTER — Encounter: Payer: Self-pay | Admitting: Physician Assistant

## 2022-09-29 ENCOUNTER — Telehealth: Payer: Self-pay | Admitting: Physician Assistant

## 2022-09-29 VITALS — BP 114/70 | HR 73 | Temp 97.8°F | Resp 16 | Ht 68.0 in | Wt 109.6 lb

## 2022-09-29 DIAGNOSIS — R636 Underweight: Secondary | ICD-10-CM | POA: Diagnosis not present

## 2022-09-29 DIAGNOSIS — Z0001 Encounter for general adult medical examination with abnormal findings: Secondary | ICD-10-CM | POA: Diagnosis not present

## 2022-09-29 DIAGNOSIS — J3089 Other allergic rhinitis: Secondary | ICD-10-CM

## 2022-09-29 DIAGNOSIS — R5383 Other fatigue: Secondary | ICD-10-CM | POA: Diagnosis not present

## 2022-09-29 DIAGNOSIS — R300 Dysuria: Secondary | ICD-10-CM | POA: Diagnosis not present

## 2022-09-29 DIAGNOSIS — R946 Abnormal results of thyroid function studies: Secondary | ICD-10-CM | POA: Diagnosis not present

## 2022-09-29 DIAGNOSIS — E782 Mixed hyperlipidemia: Secondary | ICD-10-CM | POA: Diagnosis not present

## 2022-09-29 DIAGNOSIS — B350 Tinea barbae and tinea capitis: Secondary | ICD-10-CM

## 2022-09-29 MED ORDER — LEVOCETIRIZINE DIHYDROCHLORIDE 5 MG PO TABS: 5.0000 mg | ORAL_TABLET | Freq: Every evening | ORAL | 2 refills | Status: DC

## 2022-09-29 MED ORDER — KETOCONAZOLE 2 % EX SHAM: MEDICATED_SHAMPOO | CUTANEOUS | 3 refills | Status: AC

## 2022-09-29 NOTE — Telephone Encounter (Signed)
Allergy referral faxed to [facility] ; [phone]-Toni 

## 2022-09-29 NOTE — Progress Notes (Signed)
[facility] [place], [place] [address]  Internal MEDICINE  Office Visit Note  Patient Name: Zachary Hartman  [id number]  [id number]  Date of Service: 09/29/2022  Chief Complaint  Patient presents with   Annual Exam   Gastroesophageal Reflux     HPI Pt is here for routine health maintenance examination -up 6lbs since last visit, but still underweight and will continue to work on increasing intake, particularly protein -interested in alternative to cetirizine, montelukast doesn't help much and makes him drowsy. He stopped the montelukast and drowsiness improved. Did discuss taking this at night to avoid daytime drowsiness. Will try alternative xyzal daily and will refer to allergist for testing and possible allergy shots as he has always struggled with severe allergies -no difficulty with breathing but has significant allergies. No hx of asthma -drives [employer] -some swelling along jawline at times but is not present today. He thinks it may be a lymph node that fluctuates and will keep an eye on it. Reports having a lymph node removed from leg/groin many years ago due to enlargement that stayed but was not cancerous -Will order routine fasting labs  Current Medication: Outpatient Encounter Medications as of 09/29/2022  Medication Sig Note   aspirin 325 MG tablet Take 1 tablet by mouth daily as needed. 05/27/2021: Patient taking 1 every 3 hours prn pain   fluticasone (FLONASE) 50 MCG/ACT nasal spray Place 2 sprays into both nostrils daily.    levocetirizine (XYZAL ALLERGY 24HR) 5 MG tablet Take 1 tablet (5 mg total) by mouth every evening.    montelukast (SINGULAIR) 10 MG tablet Take 1 tablet (10 mg total) by mouth at bedtime.    ondansetron (ZOFRAN-ODT) 4 MG disintegrating tablet Dissolve 1 tablet (4 mg total) by mouth once every 4 (four) hours as needed.    [DISCONTINUED] cetirizine (ZYRTEC ALLERGY) 10 MG tablet Take 1 tablet (10 mg total) by  mouth once daily.    [DISCONTINUED] cetirizine (ZYRTEC) 10 MG tablet Take 1 tablet (10 mg total) by mouth daily.    [DISCONTINUED] ketoconazole (NIZORAL) 2 % shampoo APPLY 1 APPLICATION TOPICALLY TO THE AFFECTED AREA(S) TWICE A WEEK    ketoconazole (NIZORAL) 2 % shampoo APPLY 1 APPLICATION TOPICALLY TO THE AFFECTED AREA(S) TWICE A WEEK    No facility-administered encounter medications on file as of 09/29/2022.    Surgical History: Past Surgical History:  Procedure Laterality Date   LYMPH NODE DISSECTION Left    removal of lymph node in the groin area   WISDOM TOOTH EXTRACTION     March 2018 - 3 teeth extracted    Medical History: Past Medical History:  Diagnosis Date   Acute pancreatitis 08/30/2011   Allergy    GERD (gastroesophageal reflux disease)    Migraines    Psoriasis of scalp     Family History: Family History  Problem Relation Age of Onset   Fibroids Mother    Hypertension Father    Heart disease Father        palpitations   Allergies Father    Cancer Sister        Thyroid Cancer   Breast cancer Maternal Grandmother    Hypertension Maternal Grandfather    Diabetes Maternal Grandfather    Heart attack Maternal Grandfather    Diabetes Paternal Grandfather    Colon cancer Neg Hx    Gastric cancer Neg Hx    Esophageal cancer Neg Hx       Review of Systems  Constitutional:  Negative for chills, fatigue and unexpected weight change.  HENT:  Positive for postnasal drip and sneezing. Negative for congestion, rhinorrhea and sore throat.   Eyes:  Negative for redness.  Respiratory:  Negative for cough, chest tightness and shortness of breath.   Cardiovascular:  Negative for chest pain and palpitations.  Gastrointestinal:  Negative for abdominal pain, constipation, diarrhea, nausea and vomiting.  Genitourinary:  Negative for dysuria and frequency.  Musculoskeletal:  Negative for arthralgias, back pain, joint swelling and neck pain.  Skin:  Negative for rash.   Allergic/Immunologic: Positive for environmental allergies.  Neurological: Negative.  Negative for tremors and numbness.  Hematological:  Negative for adenopathy. Does not bruise/bleed easily.  Psychiatric/Behavioral:  Negative for behavioral problems (Depression), sleep disturbance and suicidal ideas. The patient is not nervous/anxious.      Vital Signs: BP 114/70   Pulse 73   Temp 97.8 F (36.6 C)   Resp 16   Ht 5\' 8"  (1.727 m)   Wt 109 lb 9.6 oz (49.7 kg)   SpO2 99%   BMI 16.66 kg/m    Physical Exam Vitals and nursing note reviewed.  Constitutional:      General: He is not in acute distress.    Appearance: He is well-developed. He is not diaphoretic.  HENT:     Head: Normocephalic and atraumatic.     Mouth/Throat:     Pharynx: No oropharyngeal exudate.  Eyes:     Pupils: Pupils are equal, round, and reactive to light.  Neck:     Thyroid: No thyromegaly.     Vascular: No JVD.     Trachea: No tracheal deviation.  Cardiovascular:     Rate and Rhythm: Normal rate and regular rhythm.     Heart sounds: Normal heart sounds. No murmur heard.    No friction rub. No gallop.  Pulmonary:     Effort: Pulmonary effort is normal. No respiratory distress.     Breath sounds: No wheezing or rales.  Chest:     Chest wall: No tenderness.  Abdominal:     General: Bowel sounds are normal.     Palpations: Abdomen is soft.     Tenderness: There is no abdominal tenderness.  Musculoskeletal:        General: Normal range of motion.     Cervical back: Normal range of motion and neck supple.     Right lower leg: No edema.     Left lower leg: No edema.  Lymphadenopathy:     Cervical: No cervical adenopathy.  Skin:    General: Skin is warm and dry.  Neurological:     Mental Status: He is alert and oriented to person, place, and time.     Cranial Nerves: No cranial nerve deficit.  Psychiatric:        Behavior: Behavior normal.        Thought Content: Thought content normal.         Judgment: Judgment normal.      LABS: No results found for this or any previous visit (from the past 2160 hour(s)).      Assessment/Plan: 1. Encounter for general adult medical examination with abnormal findings CPE performed and routine fasting labs ordered - CBC w/Diff/Platelet - Comprehensive metabolic panel - TSH + free T4 - Lipid Panel With LDL/HDL Ratio  2. Non-seasonal allergic rhinitis due to other allergic trigger Will try switch to xyzal and will move singulair to nighttime if he would like to restart. Will also  refer to allergist due to severity of allergies and possible allergy shots - levocetirizine (XYZAL ALLERGY 24HR) 5 MG tablet; Take 1 tablet (5 mg total) by mouth every evening.  Dispense: 30 tablet; Refill: 2 - Ambulatory referral to Allergy  3. Underweight Up 6lbs since last visit and encouraged to continue to work on increasing intake, especially protein  4. Fungal scalp infection - ketoconazole (NIZORAL) 2 % shampoo; APPLY 1 APPLICATION TOPICALLY TO THE AFFECTED AREA(S) TWICE A WEEK  Dispense: 120 mL; Refill: 3  5. Mixed hyperlipidemia - Lipid Panel With LDL/HDL Ratio  6. Abnormal thyroid exam - TSH + free T4  7. Other fatigue - CBC w/Diff/Platelet - Comprehensive metabolic panel - TSH + free T4 - Lipid Panel With LDL/HDL Ratio  8. Dysuria - UA/M w/rflx Culture, Routine   General Counseling: Filmore verbalizes understanding of the findings of todays visit and agrees with plan of treatment. I have discussed any further diagnostic evaluation that may be needed or ordered today. We also reviewed his medications today. he has been encouraged to call the office with any questions or concerns that should arise related to todays visit.    Counseling:    Orders Placed This Encounter  Procedures   UA/M w/rflx Culture, Routine   CBC w/Diff/Platelet   Comprehensive metabolic panel   TSH + free T4   Lipid Panel With LDL/HDL Ratio   Ambulatory  referral to Allergy    Meds ordered this encounter  Medications   levocetirizine (XYZAL ALLERGY 24HR) 5 MG tablet    Sig: Take 1 tablet (5 mg total) by mouth every evening.    Dispense:  30 tablet    Refill:  2   ketoconazole (NIZORAL) 2 % shampoo    Sig: APPLY 1 APPLICATION TOPICALLY TO THE AFFECTED AREA(S) TWICE A WEEK    Dispense:  120 mL    Refill:  3    This patient was seen by Drema Dallas, PA-C in collaboration with Dr. Clayborn Bigness as a part of collaborative care agreement.  Total time spent:35 Minutes  Time spent includes review of chart, medications, test results, and follow up plan with the patient.     Lavera Guise, MD  Internal Medicine

## 2022-09-30 LAB — UA/M W/RFLX CULTURE, ROUTINE
Bilirubin, UA: NEGATIVE
Glucose, UA: NEGATIVE
Leukocytes,UA: NEGATIVE
Nitrite, UA: NEGATIVE
Protein,UA: NEGATIVE
RBC, UA: NEGATIVE
Specific Gravity, UA: 1.027 (ref 1.005–1.030)
Urobilinogen, Ur: 1 mg/dL (ref 0.2–1.0)
pH, UA: 6.5 (ref 5.0–7.5)

## 2022-09-30 LAB — MICROSCOPIC EXAMINATION
Bacteria, UA: NONE SEEN
Casts: NONE SEEN /lpf
Epithelial Cells (non renal): NONE SEEN /hpf (ref 0–10)
RBC, Urine: NONE SEEN /hpf (ref 0–2)
WBC, UA: NONE SEEN /hpf (ref 0–5)

## 2022-10-12 ENCOUNTER — Telehealth: Payer: Self-pay

## 2022-10-12 ENCOUNTER — Other Ambulatory Visit: Payer: Self-pay

## 2022-10-12 MED ORDER — CETIRIZINE HCL 10 MG PO TABS: 10.0000 mg | ORAL_TABLET | Freq: Every day | ORAL | 2 refills | Status: AC

## 2022-10-12 NOTE — Telephone Encounter (Signed)
Pt notified that we sent pres

## 2022-11-24 DIAGNOSIS — H1045 Other chronic allergic conjunctivitis: Secondary | ICD-10-CM | POA: Diagnosis not present

## 2022-11-24 DIAGNOSIS — R052 Subacute cough: Secondary | ICD-10-CM | POA: Diagnosis not present

## 2022-11-24 DIAGNOSIS — J3089 Other allergic rhinitis: Secondary | ICD-10-CM | POA: Diagnosis not present

## 2022-11-24 DIAGNOSIS — J301 Allergic rhinitis due to pollen: Secondary | ICD-10-CM | POA: Diagnosis not present

## 2023-03-30 ENCOUNTER — Ambulatory Visit: Payer: Medicaid Other | Admitting: Physician Assistant

## 2023-04-06 ENCOUNTER — Ambulatory Visit: Payer: Medicaid Other | Admitting: Physician Assistant

## 2023-04-20 ENCOUNTER — Other Ambulatory Visit: Payer: Self-pay

## 2023-05-09 ENCOUNTER — Telehealth: Payer: Self-pay | Admitting: Physician Assistant

## 2023-05-09 NOTE — Telephone Encounter (Signed)
Lvm to reschedule missed appt on 04/06/23

## 2023-05-22 ENCOUNTER — Other Ambulatory Visit: Payer: Self-pay | Admitting: Physician Assistant

## 2023-05-22 DIAGNOSIS — B350 Tinea barbae and tinea capitis: Secondary | ICD-10-CM

## 2023-10-05 ENCOUNTER — Encounter: Payer: Medicaid Other | Admitting: Physician Assistant
